# Patient Record
Sex: Male | Born: 1970 | Race: White | Hispanic: No | Marital: Married | Smoking: Former smoker
Health system: Southern US, Community
[De-identification: ages and names within clinical notes are randomized; demographics above are authoritative.]

## PROBLEM LIST (undated history)

## (undated) DIAGNOSIS — F411 Generalized anxiety disorder: Secondary | ICD-10-CM

## (undated) DIAGNOSIS — E782 Mixed hyperlipidemia: Secondary | ICD-10-CM

## (undated) DIAGNOSIS — R7302 Impaired glucose tolerance (oral): Secondary | ICD-10-CM

## (undated) DIAGNOSIS — E291 Testicular hypofunction: Secondary | ICD-10-CM

## (undated) DIAGNOSIS — R030 Elevated blood-pressure reading, without diagnosis of hypertension: Secondary | ICD-10-CM

## (undated) DIAGNOSIS — N200 Calculus of kidney: Secondary | ICD-10-CM

## (undated) HISTORY — DX: Mixed hyperlipidemia: E78.2

## (undated) HISTORY — DX: Calculus of kidney: N20.0

## (undated) HISTORY — DX: Testicular hypofunction: E29.1

## (undated) HISTORY — DX: Elevated blood-pressure reading, without diagnosis of hypertension: R03.0

## (undated) HISTORY — DX: Generalized anxiety disorder: F41.1

## (undated) HISTORY — DX: Impaired glucose tolerance (oral): R73.02

---

## 2003-11-19 HISTORY — PX: COLONOSCOPY: SHX174

## 2004-10-07 ENCOUNTER — Emergency Department: Payer: Self-pay | Admitting: Emergency Medicine

## 2014-01-20 ENCOUNTER — Ambulatory Visit: Payer: Self-pay | Admitting: Internal Medicine

## 2014-02-16 HISTORY — PX: LITHOTRIPSY: SUR834

## 2014-02-27 ENCOUNTER — Emergency Department: Payer: Self-pay | Admitting: Emergency Medicine

## 2014-02-27 LAB — COMPREHENSIVE METABOLIC PANEL
ALK PHOS: 109 U/L
ALT: 34 U/L (ref 12–78)
Albumin: 3.9 g/dL (ref 3.4–5.0)
Anion Gap: 6 — ABNORMAL LOW (ref 7–16)
BUN: 20 mg/dL — ABNORMAL HIGH (ref 7–18)
Bilirubin,Total: 0.4 mg/dL (ref 0.2–1.0)
CALCIUM: 9.1 mg/dL (ref 8.5–10.1)
CREATININE: 1 mg/dL (ref 0.60–1.30)
Chloride: 104 mmol/L (ref 98–107)
Co2: 29 mmol/L (ref 21–32)
EGFR (African American): >60
Glucose: 111 mg/dL — ABNORMAL HIGH (ref 65–99)
Osmolality: 281 (ref 275–301)
POTASSIUM: 3.9 mmol/L (ref 3.5–5.1)
SGOT(AST): 20 U/L (ref 15–37)
SODIUM: 139 mmol/L (ref 136–145)
TOTAL PROTEIN: 7.5 g/dL (ref 6.4–8.2)

## 2014-02-27 LAB — URINALYSIS, COMPLETE
BILIRUBIN, UR: NEGATIVE
Bacteria: NONE SEEN
Glucose,UR: NEGATIVE mg/dL (ref 0–75)
Ketone: NEGATIVE
Leukocyte Esterase: NEGATIVE
NITRITE: NEGATIVE
Ph: 8 (ref 4.5–8.0)
Protein: NEGATIVE
RBC,UR: 164 /HPF (ref 0–5)
SPECIFIC GRAVITY: 1.012 (ref 1.003–1.030)
WBC UR: 5 /HPF (ref 0–5)

## 2014-02-27 LAB — CBC WITH DIFFERENTIAL/PLATELET
Basophil #: 0.1 10*3/uL (ref 0.0–0.1)
Basophil %: 1.1 %
EOS PCT: 0.6 %
Eosinophil #: 0.1 10*3/uL (ref 0.0–0.7)
HCT: 40.7 % (ref 40.0–52.0)
HGB: 13.5 g/dL (ref 13.0–18.0)
LYMPHS PCT: 18.4 %
Lymphocyte #: 2 10*3/uL (ref 1.0–3.6)
MCH: 27.7 pg (ref 26.0–34.0)
MCHC: 33.2 g/dL (ref 32.0–36.0)
MCV: 83 fL (ref 80–100)
MONO ABS: 0.8 x10 3/mm (ref 0.2–1.0)
Monocyte %: 7.3 %
NEUTROS ABS: 7.9 10*3/uL — AB (ref 1.4–6.5)
NEUTROS PCT: 72.6 %
Platelet: 171 10*3/uL (ref 150–440)
RBC: 4.88 10*6/uL (ref 4.40–5.90)
RDW: 14.4 % (ref 11.5–14.5)
WBC: 10.8 10*3/uL — AB (ref 3.8–10.6)

## 2014-02-27 LAB — LIPASE, BLOOD: Lipase: 115 U/L (ref 73–393)

## 2014-02-27 LAB — TROPONIN I: Troponin-I: <0.02 ng/mL

## 2014-03-17 ENCOUNTER — Ambulatory Visit: Payer: Self-pay | Admitting: Urology

## 2014-04-04 ENCOUNTER — Ambulatory Visit: Payer: Self-pay | Admitting: Urology

## 2014-12-03 IMAGING — CR DG ABDOMEN 1V
1 series · 2 of 2 positions shown · non-contrast
Comparison: CT STONE STUDY dated 02/27/2014

CLINICAL DATA: Pre lithotripsy for right renal calculi

EXAM:
ABDOMEN - 1 VIEW

[Series 1: t abdomen supine · 0.14mm/px · 2 of 2 slices shown]
[im 1/2]
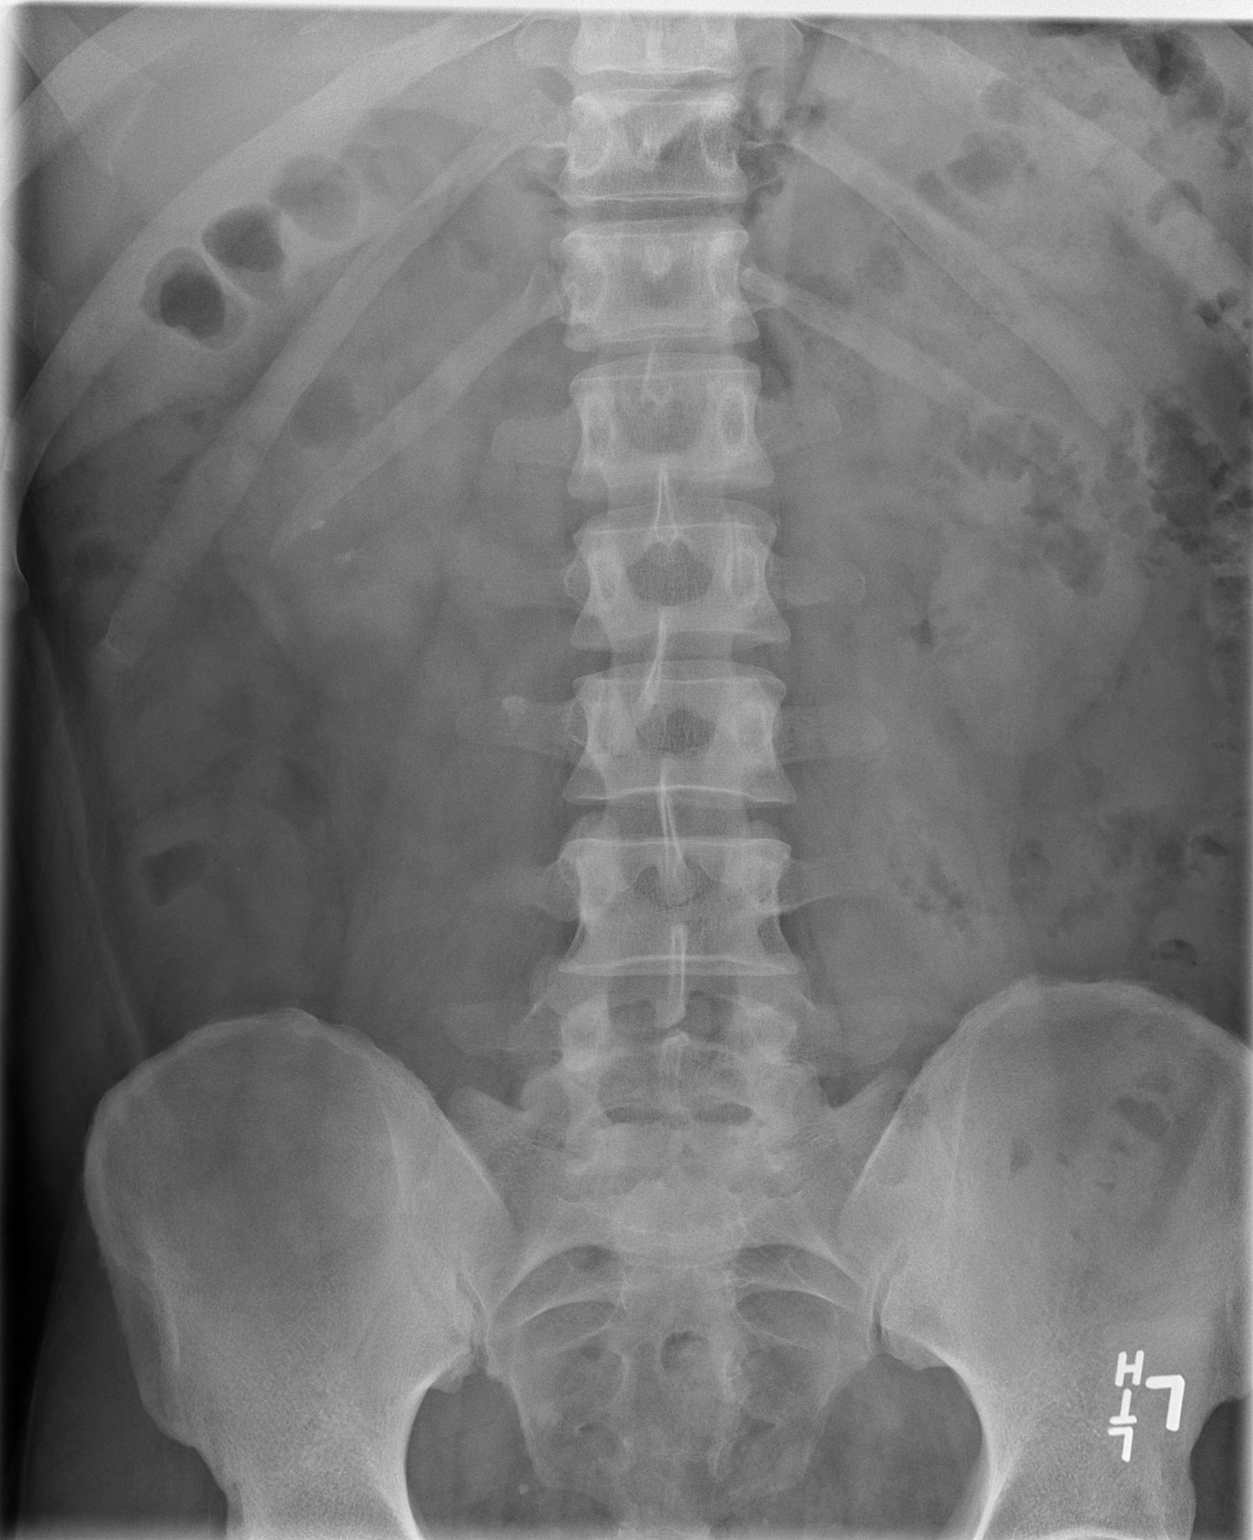
[im 2/2]
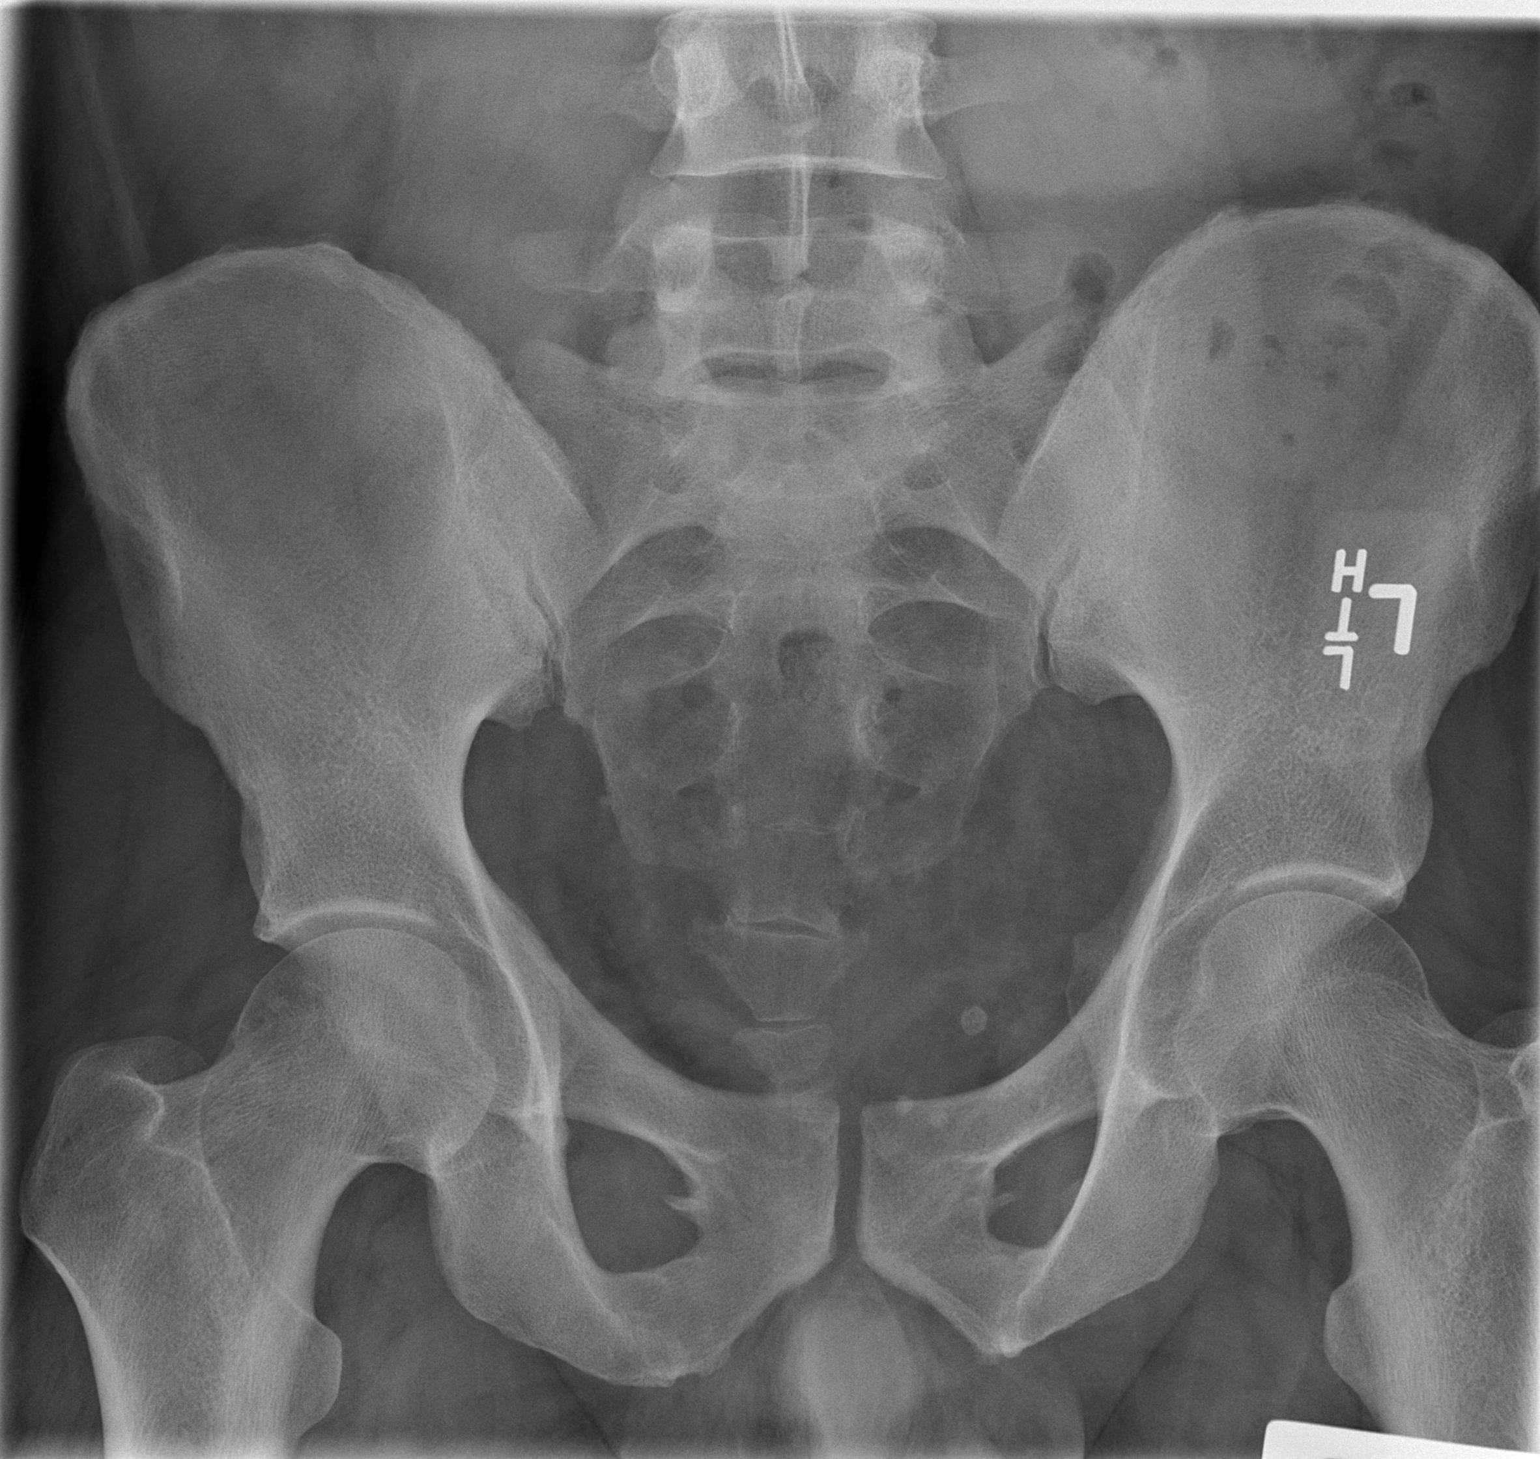

[2 of 2 positions shown; findings below may reference images not displayed]

FINDINGS: There is no bowel dilatation to suggest obstruction. There is no
evidence of pneumoperitoneum, portal venous gas or pneumatosis.
There are 2 right renal calculi. There is a 7 mm calcification
projecting over the right L3 transverse process likely representing
a ureteral calculus. The osseous structures are unremarkable.
IMPRESSION: There are 2 right renal calculi. There is a 7 mm calcification
projecting over the right L3 transverse process likely representing
a ureteral calculus.

## 2014-12-21 IMAGING — CR DG ABDOMEN 1V
1 series · 2 of 2 positions shown · non-contrast
Comparison: 03/17/2014

CLINICAL DATA: History of nephrolithiasis. Followup. No current
complaints.

EXAM:
ABDOMEN - 1 VIEW

[Series 1: t abdomen supine · 0.14mm/px · 2 of 2 slices shown]
[im 1/2]
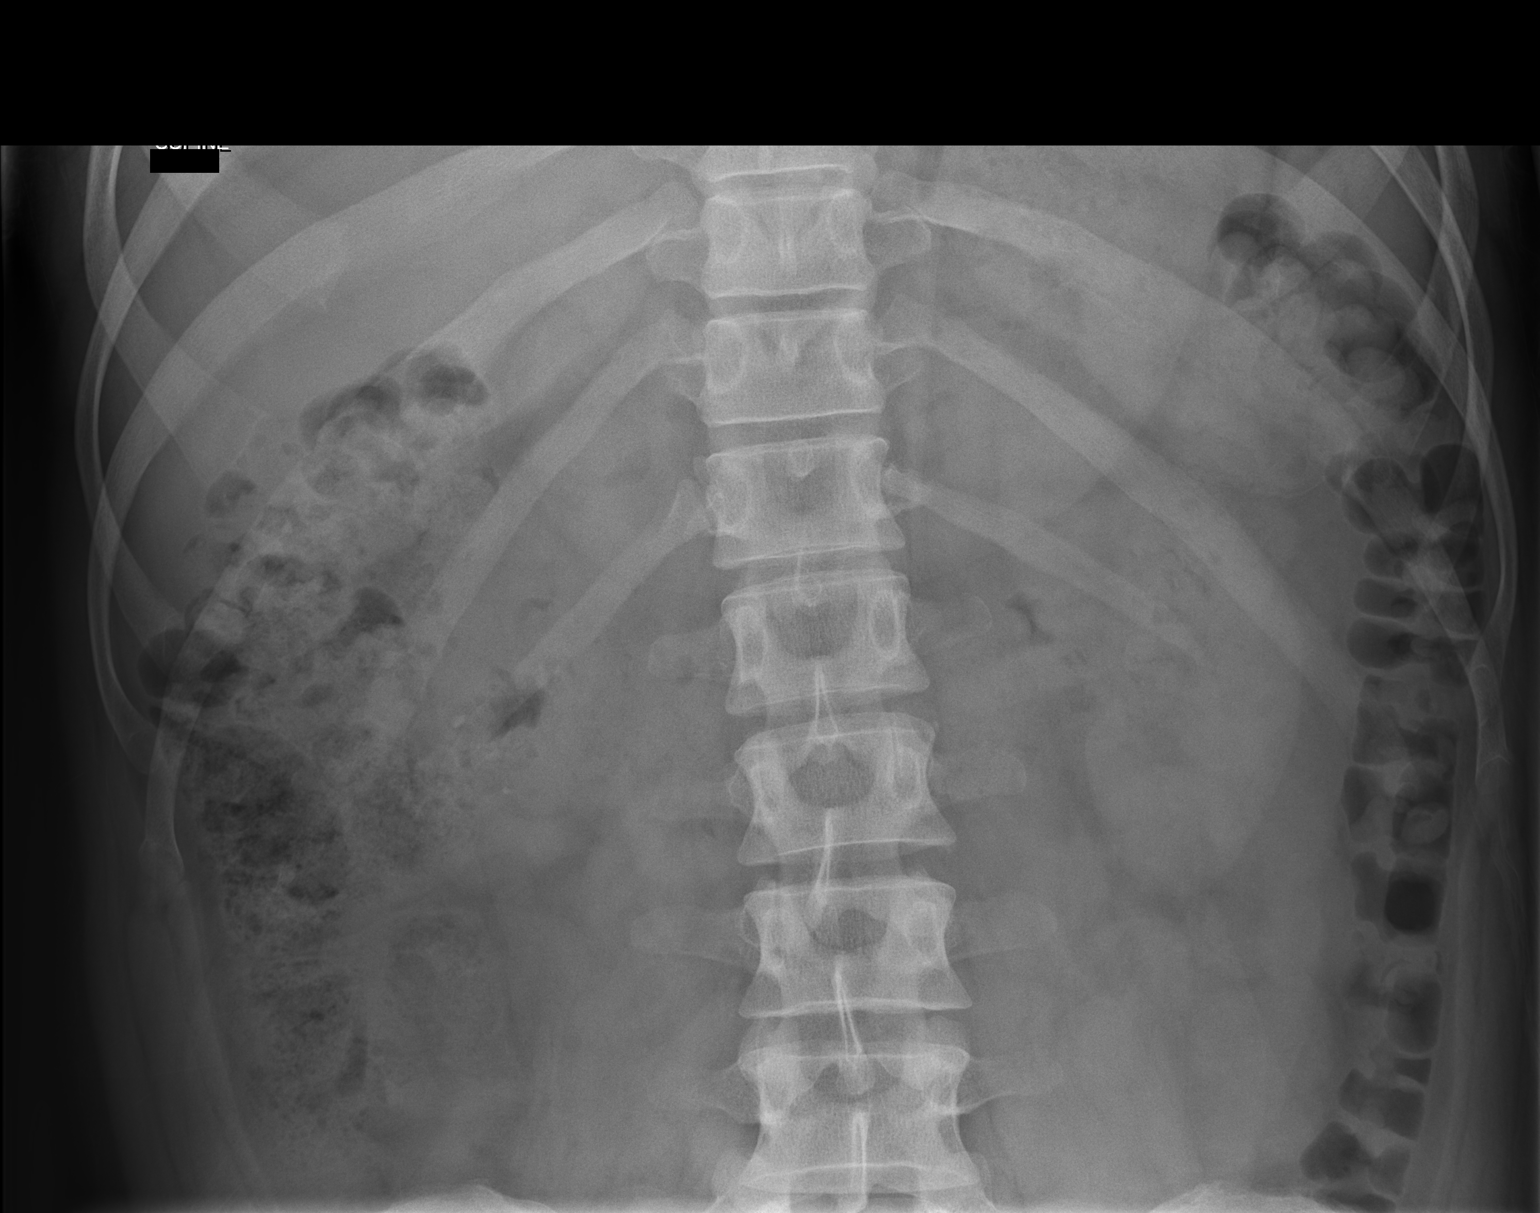
[im 2/2]
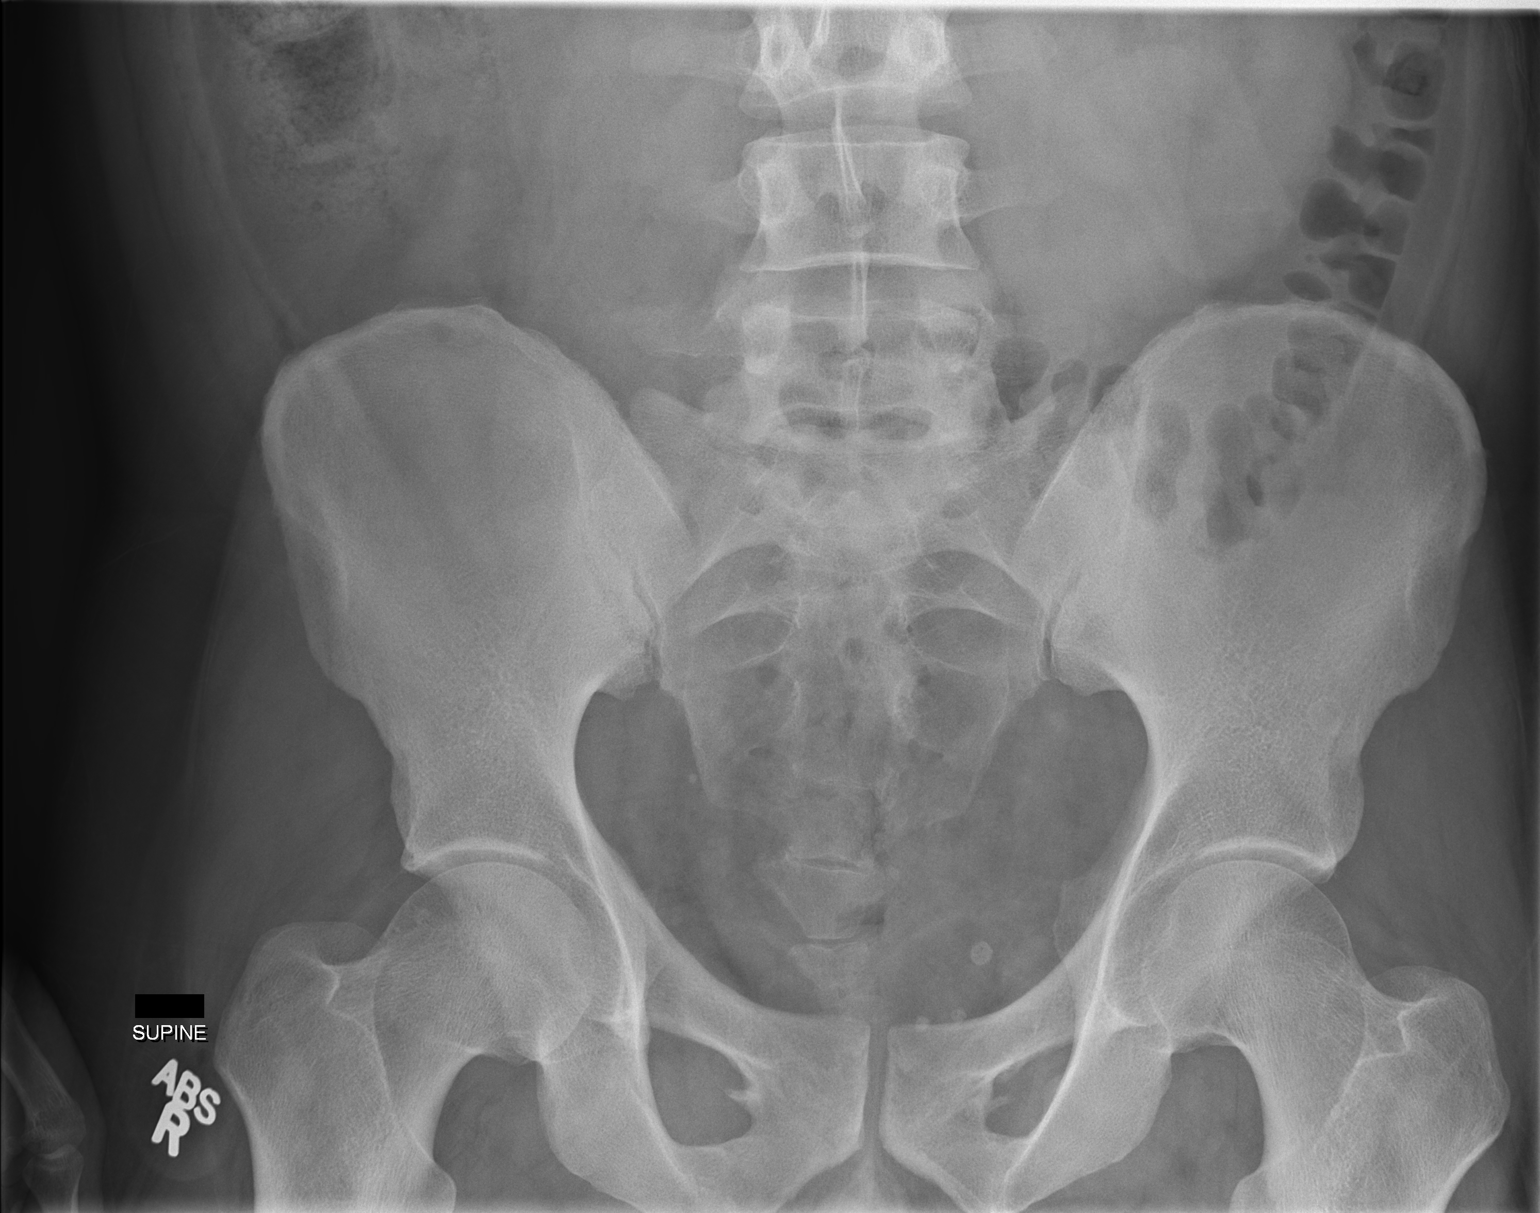

[2 of 2 positions shown; findings below may reference images not displayed]

FINDINGS: Right proximal ureteral stone previously noted is no longer present.
There are several small stones that project in the lower pole of the
right kidney. No stones are noted on the left.

Soft tissues are otherwise of possible. Normal bowel gas pattern.
Bony structures are unremarkable.
IMPRESSION: 1. No acute findings. No evidence of a ureteral stone. Previously
seen ureteral stone is no longer evident.
2. Small stable intrarenal stones on the right.

## 2014-12-29 ENCOUNTER — Ambulatory Visit: Payer: Self-pay | Admitting: Internal Medicine

## 2015-02-07 DIAGNOSIS — N200 Calculus of kidney: Secondary | ICD-10-CM | POA: Insufficient documentation

## 2015-08-16 ENCOUNTER — Encounter: Payer: Self-pay | Admitting: Internal Medicine

## 2015-08-16 ENCOUNTER — Other Ambulatory Visit: Payer: Self-pay | Admitting: Internal Medicine

## 2015-08-16 DIAGNOSIS — K219 Gastro-esophageal reflux disease without esophagitis: Secondary | ICD-10-CM | POA: Insufficient documentation

## 2015-08-16 DIAGNOSIS — M722 Plantar fascial fibromatosis: Secondary | ICD-10-CM | POA: Insufficient documentation

## 2015-08-16 DIAGNOSIS — L409 Psoriasis, unspecified: Secondary | ICD-10-CM | POA: Insufficient documentation

## 2015-08-17 ENCOUNTER — Ambulatory Visit (INDEPENDENT_AMBULATORY_CARE_PROVIDER_SITE_OTHER): Admitting: Internal Medicine

## 2015-08-17 ENCOUNTER — Encounter: Payer: Self-pay | Admitting: Internal Medicine

## 2015-08-17 VITALS — BP 128/82 | HR 80 | Temp 98.7°F | Ht 73.0 in | Wt 230.2 lb

## 2015-08-17 DIAGNOSIS — H109 Unspecified conjunctivitis: Secondary | ICD-10-CM | POA: Diagnosis not present

## 2015-08-17 DIAGNOSIS — J0110 Acute frontal sinusitis, unspecified: Secondary | ICD-10-CM

## 2015-08-17 MED ORDER — FLUTICASONE PROPIONATE 50 MCG/ACT NA SUSP: 2.0000 | Freq: Every day | NASAL | Status: DC

## 2015-08-17 MED ORDER — TRIAMCINOLONE ACETONIDE 0.025 % EX CREA: 1.0000 "application " | TOPICAL_CREAM | Freq: Two times a day (BID) | CUTANEOUS | Status: DC

## 2015-08-17 MED ORDER — NEOMYCIN-POLYMYXIN-DEXAMETH 3.5-10000-0.1 OP SUSP: 2.0000 [drp] | Freq: Four times a day (QID) | OPHTHALMIC | Status: DC

## 2015-08-17 MED ORDER — AMOXICILLIN-POT CLAVULANATE 875-125 MG PO TABS: 1.0000 | ORAL_TABLET | Freq: Two times a day (BID) | ORAL | Status: DC

## 2015-08-17 NOTE — Progress Notes (Signed)
Date:  08/17/2015   Name:  Jeffery Collins   DOB:  21-Apr-1971   MRN:  [id number]   Chief Complaint: Sinusitis Sinusitis This is a new problem. The current episode started in the past 7 days. The problem has been gradually worsening since onset. There has been no fever. He is experiencing no pain. Associated symptoms include congestion, coughing and sinus pressure. Pertinent negatives include no chills, ear pain, hoarse voice or shortness of breath. Past treatments include acetaminophen.   eye irritation - patient also notes some irritation of his left eye. Feels a little scratchy but it has not been painful. He may have had a little bit of crusting that he noticed in the morning.   Review of Systems:  Review of Systems  Constitutional: Positive for fatigue. Negative for fever and chills.  HENT: Positive for congestion and sinus pressure. Negative for ear pain and hoarse voice.   Respiratory: Positive for cough. Negative for shortness of breath and wheezing.   Cardiovascular: Negative for chest pain and palpitations.  Gastrointestinal: Negative for nausea and diarrhea.    Patient Active Problem List   Diagnosis Date Noted  . Gastro-esophageal reflux disease without esophagitis 08/16/2015  . Plantar fasciitis 08/16/2015  . Psoriasis of scalp 08/16/2015  . Calculus of kidney 02/07/2015    Prior to Admission medications   Medication Sig Start Date End Date Taking? Authorizing Provider  omeprazole (PRILOSEC OTC) 20 MG tablet Take 1 tablet by mouth daily. 12/01/14  Yes Historical Provider, MD  triamcinolone (KENALOG) 0.025 % cream Apply 1 application topically 2 (two) times daily. 05/13/14  Yes Historical Provider, MD    No Known Allergies  Past Surgical History  Procedure Laterality Date  . Lithotripsy  02/2014  . Colonoscopy  2005    Social History  Substance Use Topics  . Smoking status: Never Smoker   . Smokeless tobacco: None  . Alcohol Use: 1.2 oz/week    2 Standard  drinks or equivalent per week     Medication list has been reviewed and updated.  Physical Examination:  Physical Exam  Constitutional: He is oriented to person, place, and time. He appears well-developed and well-nourished.  HENT:  Right Ear: Tympanic membrane, external ear and ear canal normal. Tympanic membrane is not erythematous and not retracted.  Left Ear: Tympanic membrane, external ear and ear canal normal. Tympanic membrane is not erythematous and not retracted.  Nose: Right sinus exhibits no maxillary sinus tenderness and no frontal sinus tenderness. Left sinus exhibits maxillary sinus tenderness and frontal sinus tenderness.  Mouth/Throat: Uvula is midline and mucous membranes are normal. No oral lesions. Posterior oropharyngeal erythema present. No oropharyngeal exudate or posterior oropharyngeal edema.  Eyes: Conjunctivae are normal. Right eye exhibits no chemosis, no discharge and no exudate. Left eye exhibits no chemosis, no discharge and no exudate. Right conjunctiva is not injected. Left conjunctiva is not injected.  Neck: Normal range of motion. Neck supple.  Cardiovascular: Normal rate, regular rhythm and normal heart sounds.   Pulmonary/Chest: Effort normal and breath sounds normal. He has no wheezes. He has no rales.  Lymphadenopathy:    He has no cervical adenopathy.  Neurological: He is alert and oriented to person, place, and time.    BP 128/82 mmHg  Pulse 80  Temp(Src) 98.7 F (37.1 C)  Ht  (1.854 m)  Wt 230 lb 3.2 oz (104.418 kg)  BMI 30.38 kg/m2  SpO2 97%  Assessment and Plan: 1. Acute frontal sinusitis, recurrence not specified  Increase fluid intake Willette Flonase nasal spray - amoxicillin-clavulanate (AUGMENTIN) 875-125 MG tablet; Take 1 tablet by mouth 2 (two) times daily.  Dispense: 20 tablet; Refill: 0 - fluticasone (FLONASE) 50 MCG/ACT nasal spray; Place 2 sprays into both nostrils daily.  Dispense: 16 g; Refill: 6  2. Conjunctivitis of  left eye Early symptoms; will treat with Maxitrol - neomycin-polymyxin b-dexamethasone (MAXITROL) 3.5-10000-0.1 SUSP; Place 2 drops into both eyes every 6 (six) hours.  Dispense: 5 mL; Refill: 0   Bari Edward, MD [facility]  08/17/2015

## 2015-09-08 ENCOUNTER — Ambulatory Visit (INDEPENDENT_AMBULATORY_CARE_PROVIDER_SITE_OTHER): Payer: Self-pay | Admitting: Internal Medicine

## 2015-09-08 ENCOUNTER — Encounter: Payer: Self-pay | Admitting: Internal Medicine

## 2015-09-08 VITALS — BP 120/82 | HR 72 | Ht 73.0 in | Wt 231.6 lb

## 2015-09-08 DIAGNOSIS — M62830 Muscle spasm of back: Secondary | ICD-10-CM

## 2015-09-08 DIAGNOSIS — M25511 Pain in right shoulder: Secondary | ICD-10-CM

## 2015-09-08 MED ORDER — TRAMADOL HCL 50 MG PO TABS: 50.0000 mg | ORAL_TABLET | Freq: Three times a day (TID) | ORAL | Status: DC | PRN
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
## 2015-10-18 ENCOUNTER — Ambulatory Visit: Admitting: Internal Medicine

## 2015-10-18 ENCOUNTER — Encounter: Payer: Self-pay | Admitting: Internal Medicine

## 2015-10-18 DIAGNOSIS — M7541 Impingement syndrome of right shoulder: Secondary | ICD-10-CM | POA: Insufficient documentation

## 2015-10-24 ENCOUNTER — Ambulatory Visit: Admitting: Internal Medicine

## 2016-05-07 ENCOUNTER — Ambulatory Visit (INDEPENDENT_AMBULATORY_CARE_PROVIDER_SITE_OTHER): Admitting: Internal Medicine

## 2016-05-07 ENCOUNTER — Encounter: Payer: Self-pay | Admitting: Internal Medicine

## 2016-05-07 VITALS — BP 138/84 | HR 74 | Resp 16 | Ht 73.0 in | Wt 233.0 lb

## 2016-05-07 DIAGNOSIS — M7121 Synovial cyst of popliteal space [Baker], right knee: Secondary | ICD-10-CM | POA: Diagnosis not present

## 2016-05-07 NOTE — Progress Notes (Signed)
    Date:  05/07/2016   Name:  Jeffery Collins   DOB:  11/01/1971   MRN:  [id number]   Chief Complaint: Ankle Pain Knee Pain  The incident occurred 12 to 24 hours ago. There was no injury mechanism. The pain is present in the right knee. The pain is moderate. The pain has been fluctuating since onset. Associated symptoms include a loss of motion. Pertinent negatives include no muscle weakness, numbness or tingling. The symptoms are aggravated by movement and weight bearing. He has tried NSAIDs and heat for the symptoms. The treatment provided mild relief.      Review of Systems  Constitutional: Negative for fever and chills.  Respiratory: Negative for cough, chest tightness and shortness of breath.   Cardiovascular: Negative for chest pain, palpitations and leg swelling.  Musculoskeletal: Positive for myalgias, arthralgias and gait problem.  Neurological: Negative for tingling and numbness.  Hematological: Negative for adenopathy.    Patient Active Problem List   Diagnosis Date Noted  . Impingement syndrome of right shoulder 10/18/2015  . Gastro-esophageal reflux disease without esophagitis 08/16/2015  . Plantar fasciitis 08/16/2015  . Psoriasis of scalp 08/16/2015  . Calculus of kidney 02/07/2015    Prior to Admission medications   Medication Sig Start Date End Date Taking? Authorizing Provider  omeprazole (PRILOSEC OTC) 20 MG tablet Take 1 tablet by mouth daily. 12/01/14  Yes Historical Provider, MD    No Known Allergies  Past Surgical History  Procedure Laterality Date  . Lithotripsy  02/2014  . Colonoscopy  2005    Social History  Substance Use Topics  . Smoking status: Never Smoker   . Smokeless tobacco: None  . Alcohol Use: 1.2 oz/week    2 Standard drinks or equivalent per week     Medication list has been reviewed and updated.   Physical Exam  Constitutional: He is oriented to person, place, and time. He appears well-developed. No distress.  HENT:    Head: Normocephalic and atraumatic.  Cardiovascular: Normal rate, regular rhythm and normal heart sounds.   Pulmonary/Chest: Effort normal and breath sounds normal. No respiratory distress.  Musculoskeletal: Normal range of motion.       Right knee: He exhibits normal range of motion, no swelling and no effusion.       Left knee: He exhibits normal range of motion, no swelling and no effusion. Tenderness (posterior knee and mid calf) found.  Neurological: He is alert and oriented to person, place, and time. He has normal strength and normal reflexes. No sensory deficit. Gait normal.  Skin: Skin is warm and dry. No rash noted.  Psychiatric: He has a normal mood and affect. His behavior is normal. Thought content normal.  Nursing note and vitals reviewed.   BP 138/84 mmHg  Pulse 74  Resp 16  Ht 6\' 1"  (1.854 m)  Wt 233 lb (105.688 kg)  BMI 30.75 kg/m2  SpO2 98%  Assessment and Plan: 1. Baker's cyst of knee, right Suspected Continue Advil 800 mg tid, heat, elevation Refer to Ortho if persistent - VAS US LOWER EXTREMITY VENOUS (DVT); Future   Bari EdwardLaura Julee Stoll, MD [facility]  05/07/2016

## 2016-05-07 NOTE — Patient Instructions (Signed)
Baker Cyst °A Baker cyst is a sac-like structure that forms in the back of the knee. It is filled with the same fluid that is located in your knee. This fluid lubricates the bones and cartilage of the knee and allows them to move over each other more easily. °CAUSES  °When the knee becomes injured or inflamed, increased fluid forms in the knee. When this happens, the joint lining is pushed out behind the knee and forms the Baker cyst. This cyst may also be caused by inflammation from arthritic conditions and infections. °SIGNS AND SYMPTOMS  °A Baker cyst usually has no symptoms. When the cyst is substantially enlarged: °· You may feel pressure behind the knee, stiffness in the knee, or a mass in the area behind the knee. °· You may develop pain, redness, and swelling in the calf.  This can suggest a blood clot and requires evaluation by your health care provider. °DIAGNOSIS  °A Baker cyst is most often found during an ultrasound exam. This exam may have been performed for other reasons, and the cyst was found incidentally. Sometimes an MRI is used. This picks up other problems within a joint that an ultrasound exam may not. If the Baker cyst developed immediately after an injury, X-ray exams may be used to diagnose the cyst. °TREATMENT  °The treatment depends on the cause of the cyst. Anti-inflammatory medicines and rest often will be prescribed. If the cyst is caused by a bacterial infection, antibiotic medicines may be prescribed.  °HOME CARE INSTRUCTIONS  °· If the cyst was caused by an injury, for the first 24 hours, keep the injured leg elevated on 2 pillows while lying down. °· For the first 24 hours while you are awake, apply ice to the injured area: °¨ Put ice in a plastic bag. °¨ Place a towel between your skin and the bag. °¨ Leave the ice on for 20 minutes, 2-3 times a day. °· Only take over-the-counter or prescription medicines for pain, discomfort, or fever as directed by your health care  provider. °· Only take antibiotic medicine as directed. Make sure to finish it even if you start to feel better. °MAKE SURE YOU:  °· Understand these instructions. °· Will watch your condition. °· Will get help right away if you are not doing well or get worse. °  °This information is not intended to replace advice given to you by your health care provider. Make sure you discuss any questions you have with your health care provider. °  °Document Released: 11/04/2005 Document Revised: 08/25/2013 Document Reviewed: 06/16/2013 °Elsevier Interactive Patient Education ©2016 [employer] Inc[employer] ° °

## 2016-09-24 ENCOUNTER — Ambulatory Visit (INDEPENDENT_AMBULATORY_CARE_PROVIDER_SITE_OTHER)

## 2016-09-24 DIAGNOSIS — Z23 Encounter for immunization: Secondary | ICD-10-CM | POA: Diagnosis not present

## 2016-09-25 ENCOUNTER — Ambulatory Visit

## 2016-11-19 ENCOUNTER — Other Ambulatory Visit: Payer: Self-pay | Admitting: *Deleted

## 2016-11-19 ENCOUNTER — Telehealth: Payer: Self-pay | Admitting: Internal Medicine

## 2016-11-19 NOTE — Telephone Encounter (Signed)
Pt called need refill for psorises

## 2016-11-20 NOTE — Telephone Encounter (Signed)
Called pt back Dr. Judithann GravesBerglund have not treat him for this problem. Pt stated would like to make an appointment.Marland Kitchen..Marland Kitchen

## 2016-11-20 NOTE — Telephone Encounter (Signed)
I have never treated him for psoriasis and do not have any medications for this listed in his chart.

## 2016-11-26 ENCOUNTER — Ambulatory Visit (INDEPENDENT_AMBULATORY_CARE_PROVIDER_SITE_OTHER): Admitting: Internal Medicine

## 2016-11-26 ENCOUNTER — Encounter: Payer: Self-pay | Admitting: Internal Medicine

## 2016-11-26 VITALS — BP 136/90 | HR 72 | Temp 97.8°F | Ht 73.0 in | Wt 232.0 lb

## 2016-11-26 DIAGNOSIS — R030 Elevated blood-pressure reading, without diagnosis of hypertension: Secondary | ICD-10-CM | POA: Insufficient documentation

## 2016-11-26 DIAGNOSIS — L409 Psoriasis, unspecified: Secondary | ICD-10-CM

## 2016-11-26 MED ORDER — TRIAMCINOLONE ACETONIDE 0.025 % EX OINT: 1.0000 "application " | TOPICAL_OINTMENT | Freq: Two times a day (BID) | CUTANEOUS | 0 refills | Status: DC

## 2016-11-26 NOTE — Patient Instructions (Addendum)
Goal blood pressure 130/80 or less. Check BP several times a week for several weeks and if persistently elevated, return for visit and bring your cuff  DASH Eating Plan DASH stands for "Dietary Approaches to Stop Hypertension." The DASH eating plan is a healthy eating plan that has been shown to reduce high blood pressure (hypertension). Additional health benefits may include reducing the risk of type 2 diabetes mellitus, heart disease, and stroke. The DASH eating plan may also help with weight loss. What do I need to know about the DASH eating plan? For the DASH eating plan, you will follow these general guidelines:  Choose foods with less than 150 milligrams of sodium per serving (as listed on the food label).  Use salt-free seasonings or herbs instead of table salt or sea salt.  Check with your health care provider or pharmacist before using salt substitutes.  Eat lower-sodium products. These are often labeled as "low-sodium" or "no salt added."  Eat fresh foods. Avoid eating a lot of canned foods.  Eat more vegetables, fruits, and low-fat dairy products.  Choose whole grains. Look for the word "whole" as the first word in the ingredient list.  Choose fish and skinless chicken or [place]key more often than red meat. Limit fish, poultry, and meat to 6 oz (170 g) each day.  Limit sweets, desserts, sugars, and sugary drinks.  Choose heart-healthy fats.  Eat more home-cooked food and less restaurant, buffet, and fast food.  Limit fried foods.  Do not fry foods. Cook foods using methods such as baking, boiling, grilling, and broiling instead.  When eating at a restaurant, ask that your food be prepared with less salt, or no salt if possible. What foods can I eat? Seek help from a dietitian for individual calorie needs. Grains  Whole grain or whole wheat bread. Brown rice. Whole grain or whole wheat pasta. Quinoa, bulgur, and whole grain cereals. Low-sodium cereals. Corn or whole wheat  flour tortillas. Whole grain cornbread. Whole grain crackers. Low-sodium crackers. Vegetables  Fresh or frozen vegetables (raw, steamed, roasted, or grilled). Low-sodium or reduced-sodium tomato and vegetable juices. Low-sodium or reduced-sodium tomato sauce and paste. Low-sodium or reduced-sodium canned vegetables. Fruits  All fresh, canned (in natural juice), or frozen fruits. Meat and Other Protein Products  Ground beef (85% or leaner), grass-fed beef, or beef trimmed of fat. Skinless chicken or [place]key. Ground chicken or [place]key. Pork trimmed of fat. All fish and seafood. Eggs. Dried beans, peas, or lentils. Unsalted nuts and seeds. Unsalted canned beans. Dairy  Low-fat dairy products, such as skim or 1% milk, 2% or reduced-fat cheeses, low-fat ricotta or cottage cheese, or plain low-fat yogurt. Low-sodium or reduced-sodium cheeses. Fats and Oils  Tub margarines without trans fats. Light or reduced-fat mayonnaise and salad dressings (reduced sodium). Avocado. Safflower, olive, or canola oils. Natural peanut or almond butter. Other  Unsalted popcorn and pretzels. The items listed above may not be a complete list of recommended foods or beverages. Contact your dietitian for more options.  What foods are not recommended? Grains  White bread. White pasta. White rice. Refined cornbread. Bagels and croissants. Crackers that contain trans fat. Vegetables  Creamed or fried vegetables. Vegetables in a cheese sauce. Regular canned vegetables. Regular canned tomato sauce and paste. Regular tomato and vegetable juices. Fruits  Canned fruit in light or heavy syrup. Fruit juice. Meat and Other Protein Products  Fatty cuts of meat. Ribs, chicken wings, bacon, sausage, bologna, salami, chitterlings, fatback, hot dogs, bratwurst, and packaged  luncheon meats. Salted nuts and seeds. Canned beans with salt. Dairy  Whole or 2% milk, cream, half-and-half, and cream cheese. Whole-fat or sweetened yogurt. Full-fat  cheeses or blue cheese. Nondairy creamers and whipped toppings. Processed cheese, cheese spreads, or cheese curds. Condiments  Onion and garlic salt, seasoned salt, table salt, and sea salt. Canned and packaged gravies. Worcestershire sauce. Tartar sauce. Barbecue sauce. Teriyaki sauce. Soy sauce, including reduced sodium. Steak sauce. Fish sauce. Oyster sauce. Cocktail sauce. Horseradish. Ketchup and mustard. Meat flavorings and tenderizers. Bouillon cubes. Hot sauce. Tabasco sauce. Marinades. Taco seasonings. Relishes. Fats and Oils  Butter, stick margarine, lard, shortening, ghee, and bacon fat. Coconut, palm kernel, or palm oils. Regular salad dressings. Other  Pickles and olives. Salted popcorn and pretzels. The items listed above may not be a complete list of foods and beverages to avoid. Contact your dietitian for more information.  Where can I find more information? National Heart, Lung, and Blood Institute: travelstabloid.com This information is not intended to replace advice given to you by your health care provider. Make sure you discuss any questions you have with your health care provider. Document Released: 10/24/2011 Document Revised: 04/11/2016 Document Reviewed: 09/08/2013 Elsevier Interactive Patient Education  2017 [employer].

## 2016-11-26 NOTE — Progress Notes (Signed)
    Date:  11/26/2016   Name:  Jeffery Collins   DOB:  01/20/1971   MRN:  [id number]   Chief Complaint: Psoriasis Psoriasis  This is a recurrent problem. The current episode started more than 1 year ago. The problem has been gradually worsening since onset. The affected locations include the scalp and face.  He was given TAC years ago in the [employer] [facility] but had not needed it until recently.  The cold weather he is blaming for the flare.   Review of Systems  Eyes: Negative for visual disturbance.  Respiratory: Negative for shortness of breath.   Cardiovascular: Positive for chest pain (he notices that his BP is slightly elevated on the last few visits). Negative for palpitations.  Skin: Positive for rash.    Patient Active Problem List   Diagnosis Date Noted  . Elevated blood pressure reading 11/26/2016  . Impingement syndrome of right shoulder 10/18/2015  . Gastro-esophageal reflux disease without esophagitis 08/16/2015  . Plantar fasciitis 08/16/2015  . Psoriasis of scalp 08/16/2015  . Calculus of kidney 02/07/2015    Prior to Admission medications   Medication Sig Start Date End Date Taking? Authorizing Provider  omeprazole (PRILOSEC OTC) 20 MG tablet Take 1 tablet by mouth daily. 12/01/14  Yes Historical Provider, MD    No Known Allergies  Past Surgical History:  Procedure Laterality Date  . COLONOSCOPY  2005  . LITHOTRIPSY  02/2014    Social History  Substance Use Topics  . Smoking status: Never Smoker  . Smokeless tobacco: Not on file  . Alcohol use 1.2 oz/week    2 Standard drinks or equivalent per week     Medication list has been reviewed and updated.   Physical Exam  Constitutional: He is oriented to person, place, and time. He appears well-developed. No distress.  HENT:  Head: Normocephalic and atraumatic.  Cardiovascular: Normal rate, regular rhythm and normal heart sounds.   Pulmonary/Chest: Effort normal and breath sounds normal. No respiratory  distress.  Musculoskeletal: Normal range of motion.  Neurological: He is alert and oriented to person, place, and time.  Skin: Skin is warm and dry. No rash noted. There is erythema.  Scattered plaques over scalp - mild erythema and minimal scaling.  Evidence of excoriation on several.  Few lesions behind ears.  Slight redness across bridge of nose and forehead.  Psychiatric: He has a normal mood and affect. His behavior is normal. Thought content normal.  Nursing note and vitals reviewed.   BP 136/90   Pulse 72   Temp 97.8 F (36.6 C)   Ht 6\' 1"  (1.854 m)   Wt 232 lb (105.2 kg)   SpO2 98%   BMI 30.61 kg/m   Assessment and Plan: 1. Psoriasis of scalp Start with low dose on scalp and face May need stronger on scalp if no improvement - triamcinolone (KENALOG) 0.025 % ointment; Apply 1 application topically 2 (two) times daily.  Dispense: 30 g; Refill: 0  2. Elevated blood pressure reading DASH diet Monitor at home - return if consistently > 130/80   Bari EdwardLaura Takiera Mayo, MD [facility]  11/26/2016

## 2017-01-01 ENCOUNTER — Ambulatory Visit (INDEPENDENT_AMBULATORY_CARE_PROVIDER_SITE_OTHER): Admitting: Internal Medicine

## 2017-01-01 ENCOUNTER — Encounter: Payer: Self-pay | Admitting: Internal Medicine

## 2017-01-01 VITALS — BP 142/92 | HR 104 | Temp 99.1°F | Ht 73.0 in | Wt 234.0 lb

## 2017-01-01 DIAGNOSIS — J0100 Acute maxillary sinusitis, unspecified: Secondary | ICD-10-CM

## 2017-01-01 DIAGNOSIS — J111 Influenza due to unidentified influenza virus with other respiratory manifestations: Secondary | ICD-10-CM

## 2017-01-01 DIAGNOSIS — R69 Illness, unspecified: Secondary | ICD-10-CM | POA: Diagnosis not present

## 2017-01-01 MED ORDER — AZITHROMYCIN 250 MG PO TABS: ORAL_TABLET | ORAL | 0 refills | Status: DC

## 2017-01-01 MED ORDER — OSELTAMIVIR PHOSPHATE 75 MG PO CAPS
75.0000 mg | ORAL_CAPSULE | Freq: Two times a day (BID) | ORAL | 0 refills | Status: DC
Start: 2017-01-01 — End: 2017-02-20

## 2017-01-01 MED ORDER — GUAIFENESIN-CODEINE 100-10 MG/5ML PO SYRP
5.0000 mL | ORAL_SOLUTION | Freq: Three times a day (TID) | ORAL | 0 refills | Status: DC | PRN
Start: 2017-01-01 — End: 2017-02-20

## 2017-01-01 NOTE — Patient Instructions (Signed)
Take Advil 600 mg four times a day

## 2017-01-01 NOTE — Progress Notes (Signed)
Date:  01/01/2017   Name:  Jeffery Collins   DOB:  10/19/1971   MRN:  [id number]   Chief Complaint: Sinus Problem (Pt stated sinus problem, body ache, vomiting for 2 days) URI   This is a new problem. The current episode started yesterday. The problem has been gradually worsening. The maximum temperature recorded prior to his arrival was 100.4 - 100.9 F. The fever has been present for 1 to 2 days. Associated symptoms include congestion, coughing, headaches, nausea, sinus pain and vomiting. Pertinent negatives include no chest pain, diarrhea, plugged ear sensation, rash or wheezing. He has tried acetaminophen for the symptoms.  His wife and son both had confirmed influenza 2 weeks ago.   He is taking tylenol or advil for fever.    Review of Systems  Constitutional: Positive for chills, fatigue and fever.  HENT: Positive for congestion and sinus pain.   Respiratory: Positive for cough. Negative for chest tightness, shortness of breath and wheezing.   Cardiovascular: Negative for chest pain and palpitations.  Gastrointestinal: Positive for nausea and vomiting. Negative for diarrhea.  Musculoskeletal: Positive for myalgias.  Skin: Negative for rash.  Neurological: Positive for headaches. Negative for dizziness and numbness.    Patient Active Problem List   Diagnosis Date Noted  . Elevated blood pressure reading 11/26/2016  . Impingement syndrome of right shoulder 10/18/2015  . Gastro-esophageal reflux disease without esophagitis 08/16/2015  . Plantar fasciitis 08/16/2015  . Psoriasis of scalp 08/16/2015  . Calculus of kidney 02/07/2015    Prior to Admission medications   Medication Sig Start Date End Date Taking? Authorizing Provider  omeprazole (PRILOSEC OTC) 20 MG tablet Take 1 tablet by mouth daily. 12/01/14  Yes Historical Provider, MD  triamcinolone (KENALOG) 0.025 % ointment Apply 1 application topically 2 (two) times daily. 11/26/16  Yes Reubin MilanLaura H Traycen Goyer, MD    No Known  Allergies  Past Surgical History:  Procedure Laterality Date  . COLONOSCOPY  2005  . LITHOTRIPSY  02/2014    Social History  Substance Use Topics  . Smoking status: Never Smoker  . Smokeless tobacco: Never Used  . Alcohol use 1.2 oz/week    2 Standard drinks or equivalent per week     Medication list has been reviewed and updated.   Physical Exam  Constitutional: He has a sickly appearance.  HENT:  Right Ear: Tympanic membrane and ear canal normal.  Left Ear: Tympanic membrane and ear canal normal.  Nose: Right sinus exhibits maxillary sinus tenderness. Left sinus exhibits maxillary sinus tenderness.  Mouth/Throat: No posterior oropharyngeal erythema.  Cardiovascular: Regular rhythm and normal heart sounds.  Tachycardia present.   Pulmonary/Chest: He has no decreased breath sounds. He has no wheezes. He has no rhonchi.  Nursing note and vitals reviewed.   BP (!) 142/92   Pulse (!) 104   Temp 99.1 F (37.3 C)   Ht 6\' 1"  (1.854 m)   Wt 234 lb (106.1 kg)   SpO2 97%   BMI 30.87 kg/m   Assessment and Plan: 1. Acute non-recurrent maxillary sinusitis - azithromycin (ZITHROMAX Z-PAK) 250 MG tablet; UAD  Dispense: 6 each; Refill: 0  2. Influenza-like illness Continue Advil 600 mg four times per day Fluids, rest (note for light duty given) - oseltamivir (TAMIFLU) 75 MG capsule; Take 1 capsule (75 mg total) by mouth 2 (two) times daily.  Dispense: 10 capsule; Refill: 0 - guaiFENesin-codeine (ROBITUSSIN AC) 100-10 MG/5ML syrup; Take 5 mLs by mouth 3 (three) times daily  as needed for cough.  Dispense: 150 mL; Refill: 0   Bari Edward, MD [facility]  01/01/2017

## 2017-02-20 ENCOUNTER — Encounter: Payer: Self-pay | Admitting: Internal Medicine

## 2017-02-20 ENCOUNTER — Ambulatory Visit (INDEPENDENT_AMBULATORY_CARE_PROVIDER_SITE_OTHER): Admitting: Internal Medicine

## 2017-02-20 VITALS — BP 122/66 | HR 68 | Ht 73.0 in | Wt 238.0 lb

## 2017-02-20 DIAGNOSIS — B070 Plantar wart: Secondary | ICD-10-CM

## 2017-02-20 NOTE — Progress Notes (Signed)
    Date:  02/20/2017   Name:  Jeffery Collins   DOB:  [birth year]   MRN:  [id number]   Jeffery Collins has noticed a hard lesion on the ball of his left foot for a short while.  Over the past 2 weeks it is more painful to walk and run and now he has another lesion near the heel.     Review of Systems  Constitutional: Negative for chills and fatigue.  Respiratory: Negative for shortness of breath.   Cardiovascular: Negative for chest pain.  Skin:       Hard lesions on left foot  Neurological: Negative for tremors and weakness.    Patient Active Problem List   Diagnosis Date Noted  . Elevated blood pressure reading 11/26/2016  . Impingement syndrome of right shoulder 10/18/2015  . Gastro-esophageal reflux disease without esophagitis 08/16/2015  . Plantar fasciitis 08/16/2015  . Psoriasis of scalp 08/16/2015  . Calculus of kidney 02/07/2015    Prior to Admission medications   Medication Sig Start Date End Date Taking? Authorizing Provider  cyclobenzaprine (FLEXERIL) 5 MG tablet  01/10/17   Historical Provider, MD  etodolac (LODINE) 500 MG tablet Take 1 tablet by mouth 2 (two) times daily. 01/10/17   Historical Provider, MD  omeprazole (PRILOSEC OTC) 20 MG tablet Take 1 tablet by mouth daily. 12/01/14   Historical Provider, MD  triamcinolone (KENALOG) 0.025 % ointment Apply 1 application topically 2 (two) times daily. 11/26/16   Reubin Milan, MD    No Known Allergies  Past Surgical History:  Procedure Laterality Date  . COLONOSCOPY  2005  . LITHOTRIPSY  02/2014    Social History  Substance Use Topics  . Smoking status: Never Smoker  . Smokeless tobacco: Never Used  . Alcohol use 1.2 oz/week    2 Standard drinks or equivalent per week     Medication list has been reviewed and updated.   Physical Exam  Constitutional: He is oriented to person, place, and time. He appears well-developed. No distress.  HENT:  Head: Normocephalic and atraumatic.  Pulmonary/Chest:  Effort normal. No respiratory distress.  Musculoskeletal: Normal range of motion.       Feet:  Neurological: He is alert and oriented to person, place, and time.  Skin: Skin is warm and dry. No rash noted.  Psychiatric: He has a normal mood and affect. His behavior is normal. Thought content normal.  Nursing note and vitals reviewed.   BP 122/66 (BP Location: Right Arm, Patient Position: Sitting, Cuff Size: Large)   Pulse 68   Ht  (1.854 m)   Wt 238 lb (108 kg)   SpO2 97%   BMI 31.40 kg/m   Assessment and Plan: 1. Plantar wart of left foot Try compound W otc Will likely need more definitive tx with Dermatology - Ambulatory referral to Dermatology   No orders of the defined types were placed in this encounter.   Bari Edward, MD [facility]  02/20/2017

## 2017-02-24 ENCOUNTER — Ambulatory Visit: Payer: Self-pay | Admitting: Podiatry

## 2017-03-06 ENCOUNTER — Encounter: Payer: Self-pay | Admitting: Podiatry

## 2017-03-06 ENCOUNTER — Ambulatory Visit (INDEPENDENT_AMBULATORY_CARE_PROVIDER_SITE_OTHER): Admitting: Podiatry

## 2017-03-06 VITALS — BP 128/91 | HR 86

## 2017-03-06 DIAGNOSIS — B070 Plantar wart: Secondary | ICD-10-CM

## 2017-03-06 DIAGNOSIS — L989 Disorder of the skin and subcutaneous tissue, unspecified: Secondary | ICD-10-CM

## 2017-03-06 NOTE — Addendum Note (Signed)
Addended byMaury Dus, Jahnessa Vanduyn L on: 03/06/2017 10:47 AM   Modules accepted: Orders

## 2017-03-06 NOTE — Progress Notes (Signed)
   Subjective:    Patient ID: Jeffery Collins, male    DOB: 06-08-1971, 46 y.o.   MRN: [id number]  HPI    Review of Systems  All other systems reviewed and are negative.      Objective:   Physical Exam GENERAL APPEARANCE: Alert, conversant. Appropriately groomed. No acute distress.  VASCULAR: Pedal pulses are  palpable at  [facility] and PT bilateral.  Capillary refill time is immediate to all digits,  Normal temperature gradient.  Digital hair growth is present bilateral  NEUROLOGIC: sensation is normal to 5.07 monofilament at 5/5 sites bilateral.  Light touch is intact bilateral, Muscle strength normal.  MUSCULOSKELETAL: acceptable muscle strength, tone and stability bilateral.  Intrinsic muscluature intact bilateral.  Rectus appearance of foot and digits noted bilateral.   DERMATOLOGIC: skin color, texture, and turgor are within normal limits.  No preulcerative lesions or ulcers  are seen, no interdigital maceration noted.  No open lesions present.  Digital nails are asymptomatic. No drainage noted. Two well defined, well circumscribed lesions with cauliflower center.  Pain from lateral pressure.         Assessment & Plan:  Benign skin lesion  Plantar warts  IE  Excision of wart.  This patient was anesthetized with mixture of 2% lidocaine plain and 2 % lidocaine with epi. at the site of the skin lesion.  The surgical site was then washed with betadine and alcohol.  Using a punch the lesion was excised and passed off as specimen.  The surgical site was cauterized with phenol and washed with alcohol.  The site was bandaged with neosporin, sterile 2x2 and kling.  Home instructions given.   Helane Gunther DPM

## 2017-03-13 ENCOUNTER — Ambulatory Visit (INDEPENDENT_AMBULATORY_CARE_PROVIDER_SITE_OTHER): Admitting: Podiatry

## 2017-03-13 DIAGNOSIS — Z9889 Other specified postprocedural states: Secondary | ICD-10-CM

## 2017-03-13 NOTE — Progress Notes (Signed)
This patient returns to the office follow-up for skin surgery for the removal of 2 plantar warts, right foot. He says he has been soaking his foot and bandaging his foot as directed. He states there still mild tenderness noted at the site of the surgeries, but this is occasional in nature. He presents the office today for an evaluation and treatment of his right foot  GENERAL APPEARANCE: Alert, conversant. Appropriately groomed. No acute distress.  VASCULAR: Pedal pulses are  palpable at  [facility] and PT bilateral.  Capillary refill time is immediate to all digits,  Normal temperature gradient.  Digital hair growth is present bilateral  NEUROLOGIC: sensation is normal to 5.07 monofilament at 5/5 sites bilateral.  Light touch is intact bilateral, Muscle strength normal.  MUSCULOSKELETAL: acceptable muscle strength, tone and stability bilateral.  Intrinsic muscluature intact bilateral.  Rectus appearance of foot and digits noted bilateral.   DERMATOLOGIC: skin color, texture, and turgor are within normal limits.  No preulcerative lesions or ulcers  are seen, no interdigital maceration noted.  No open lesions present.  Digital nails are asymptomatic. No drainage noted. The surgical sites have normal granulation tissue noted in the absence of any infection or drainage   S/P skin surgery  ROV  examine the surgical sites, and normal healing is progressing nicely. Patient to return to the office of problems occur. Return to clinic when necessary   Helane Gunther DPM

## 2017-04-21 ENCOUNTER — Ambulatory Visit (INDEPENDENT_AMBULATORY_CARE_PROVIDER_SITE_OTHER): Admitting: Podiatry

## 2017-04-21 ENCOUNTER — Encounter: Payer: Self-pay | Admitting: Podiatry

## 2017-04-21 DIAGNOSIS — L989 Disorder of the skin and subcutaneous tissue, unspecified: Secondary | ICD-10-CM | POA: Diagnosis not present

## 2017-04-21 DIAGNOSIS — B070 Plantar wart: Secondary | ICD-10-CM

## 2017-04-21 NOTE — Progress Notes (Signed)
This patient returns to the office follow-up for skin surgery for the removal of 2 plantar warts, right foot. He says he has been feeling pain at the site  smaller wart near the ball of his left foot.  There is no pain to the wart area removed near his heel.  He says there is pain during walking and believes the smaller wart has returned.  GENERAL APPEARANCE: Alert, conversant. Appropriately groomed. No acute distress.  VASCULAR: [facility]al [facility]  [facility]  [facility].  Capillary refill time is immediate to all digits,  Normal temperature gradient.  Digital hair growth is present bilateral  NEUROLOGIC: sensation is normal to 5.07 monofilament at 5/5 sites bilateral.  Light touch is intact bilateral, Muscle strength normal.  MUSCULOSKELETAL: acceptable muscle strength, tone and stability bilateral.  Intrinsic muscluature intact bilateral.  Rectus appearance of foot and digits noted bilateral.   DERMATOLOGIC: skin color, texture, and turgor are within normal limits.  No preulcerative lesions or ulcers  are seen, no interdigital maceration noted.  No open lesions present.  Digital nails are asymptomatic. No drainage noted. The surgical site near the heel has healed with no pain.  He has small cauliflower skin lesion proximal to ball of foot left.   S/P skin surgery Wart  Benign skin lesion.  ROV  examine the surgical sites, and normal healing is progressing nicely at the wart near heel.  Wart has regrown at the site of left forefoot.   Application of acid to site of wart left foot.  RTC 1 week. Patient to return to the office of problems occur. Return to clinic when necessary   Helane GuntherGregory Stormy Sabol DPM

## 2017-04-28 ENCOUNTER — Encounter: Payer: Self-pay | Admitting: Podiatry

## 2017-04-28 ENCOUNTER — Ambulatory Visit (INDEPENDENT_AMBULATORY_CARE_PROVIDER_SITE_OTHER): Admitting: Podiatry

## 2017-04-28 DIAGNOSIS — Z9889 Other specified postprocedural states: Secondary | ICD-10-CM | POA: Diagnosis not present

## 2017-04-28 NOTE — Progress Notes (Signed)
This patient returns to the office 1 week after the application of acid to the bottom of his right foot. He says there was a little discomfort the first night but since then it is healed and not cause any pain or discomfort. No pain at the wart near his heel. He presents the office for evaluation of the wart site and the acid application  GENERAL APPEARANCE: Alert, conversant. Appropriately groomed. No acute distress.  VASCULAR: Pedal [facility] are  [facility]  [facility].  Capillary refill time is immediate to all digits,  Normal temperature gradient.  Digital hair growth is present bilateral  NEUROLOGIC: sensation is normal to 5.07 monofilament at 5/5 sites bilateral.  Light touch is intact bilateral, Muscle strength normal.  MUSCULOSKELETAL: acceptable muscle strength, tone and stability bilateral.  Intrinsic muscluature intact bilateral.  Rectus appearance of foot and digits noted bilateral.   DERMATOLOGIC: skin color, texture, and turgor are within normal limits.  no interdigital maceration noted.  No open lesions present.  Digital nails are asymptomatic. No drainage noted. Examination of the wart site does reveal healing and desquamation noted at the site of the previous wart mild redness noted at this site of the application of the acid no pain is noted at this time.  S/P acid application right foot  ROV. Examination of his foot does reveal healing noted at the site of the acid application. This is healing nicely with no pain, discomfort. Patient was told to return to the office if the problem continues.  He says the acid treatment seems to have been easier than the actual skin lesion excision   Helane GuntherGregory Tynisa Vohs DPM

## 2017-06-23 ENCOUNTER — Encounter: Payer: Self-pay | Admitting: Internal Medicine

## 2017-06-23 ENCOUNTER — Ambulatory Visit (INDEPENDENT_AMBULATORY_CARE_PROVIDER_SITE_OTHER): Admitting: Internal Medicine

## 2017-06-23 VITALS — BP 138/86 | HR 82 | Ht 73.0 in | Wt 231.0 lb

## 2017-06-23 DIAGNOSIS — R197 Diarrhea, unspecified: Secondary | ICD-10-CM

## 2017-06-23 DIAGNOSIS — K625 Hemorrhage of anus and rectum: Secondary | ICD-10-CM | POA: Diagnosis not present

## 2017-06-23 MED ORDER — METRONIDAZOLE 500 MG PO TABS: 500.0000 mg | ORAL_TABLET | Freq: Two times a day (BID) | ORAL | 0 refills | Status: DC

## 2017-06-23 NOTE — Progress Notes (Signed)
Date:  06/23/2017   Name:  Jeffery Collins   DOB:  01/29/1971   MRN:  [id number]   Chief Complaint: Diarrhea (Happening for several months. Having loose/ watery stools. Diet has not changed. Drink lots of water and taking fiber supplements. Seen blood in stool this morning. ) Diarrhea   This is a new problem. The current episode started more than 1 month ago. The problem occurs 2 to 4 times per day. The problem has been unchanged. The stool consistency is described as blood tinged and watery (just noted blood today). Associated symptoms include abdominal pain and increased flatus. Pertinent negatives include no arthralgias, bloating, chills, coughing, fever, vomiting or weight loss. He has tried increased fluids for the symptoms. The treatment provided no relief.  He has taken some Pepto the last few days with no change.  He is no longer taking omeprazole.  Wt Readings from Last 3 Encounters:  06/23/17 231 lb (104.8 kg)  02/20/17 238 lb (108 kg)  01/01/17 234 lb (106.1 kg)     Review of Systems  Constitutional: Negative for chills, fatigue, fever, unexpected weight change and weight loss.  Respiratory: Negative for cough and chest tightness.   Cardiovascular: Negative for chest pain.  Gastrointestinal: Positive for abdominal pain, anal bleeding, diarrhea and flatus. Negative for bloating and vomiting.  Genitourinary: Negative for difficulty urinating.  Musculoskeletal: Negative for arthralgias.  Neurological: Negative for dizziness and numbness.    Patient Active Problem List   Diagnosis Date Noted  . Elevated blood pressure reading 11/26/2016  . Impingement syndrome of right shoulder 10/18/2015  . Gastro-esophageal reflux disease without esophagitis 08/16/2015  . Plantar fasciitis 08/16/2015  . Psoriasis of scalp 08/16/2015  . Calculus of kidney 02/07/2015    Prior to Admission medications   Medication Sig Start Date End Date Taking? Authorizing Provider    cyclobenzaprine (FLEXERIL) 5 MG tablet  01/10/17  Yes [provider]      Yes [provider]  triamcinolone (KENALOG) 0.025 % ointment Apply 1 application topically 2 (two) times daily. 11/26/16  Yes Reubin MilanBerglund, Lendora Keys H, MD    No Known Allergies  Past Surgical History:  Procedure Laterality Date  . COLONOSCOPY  2005  . LITHOTRIPSY  02/2014    Social History  Substance Use Topics  . Smoking status: Never Smoker  . Smokeless tobacco: Never Used  . Alcohol use 1.2 oz/week    2 Standard drinks or equivalent per week     Medication list has been reviewed and updated.   Physical Exam  Constitutional: He is oriented to person, place, and time. He appears well-developed. No distress.  HENT:  Head: Normocephalic and atraumatic.  Cardiovascular: Normal rate, regular rhythm and normal heart sounds.   Pulmonary/Chest: Effort normal and breath sounds normal. No respiratory distress. He has no wheezes.  Abdominal: Soft. He exhibits no mass. Bowel sounds are decreased. There is generalized tenderness. There is no rigidity, no rebound, no guarding and no CVA tenderness.  Musculoskeletal: Normal range of motion.  Neurological: He is alert and oriented to person, place, and time.  Skin: Skin is warm and dry. No rash noted.  Psychiatric: He has a normal mood and affect. His behavior is normal. Thought content normal.  Nursing note and vitals reviewed.   BP 138/86   Pulse 82   Ht 6\' 1"  (1.854 m)   Wt 231 lb (104.8 kg)   SpO2 97%   BMI 30.48 kg/m   Assessment and Plan: 1.  Diarrhea of presumed infectious origin Treat for possible giardia - Comprehensive metabolic panel - metroNIDAZOLE (FLAGYL) 500 MG tablet; Take 1 tablet (500 mg total) by mouth 2 (two) times daily.  Dispense: 14 tablet; Refill: 0 - Ambulatory referral to Gastroenterology  2. Rectal bleeding Need GI consultation for possible colonoscopy - CBC with Differential/Platelet - Ambulatory referral to  Gastroenterology   Meds ordered this encounter  Medications  . metroNIDAZOLE (FLAGYL) 500 MG tablet    Sig: Take 1 tablet (500 mg total) by mouth 2 (two) times daily.    Dispense:  14 tablet    Refill:  0    Bari Edward, MD [facility]  06/23/2017

## 2017-06-24 LAB — CBC WITH DIFFERENTIAL/PLATELET
BASOS: 0 %
Basophils Absolute: 0 10*3/uL (ref 0.0–0.2)
EOS (ABSOLUTE): 0.1 10*3/uL (ref 0.0–0.4)
EOS: 2 %
HEMATOCRIT: 41.8 % (ref 37.5–51.0)
Hemoglobin: 14.8 g/dL (ref 13.0–17.7)
IMMATURE GRANS (ABS): 0 10*3/uL (ref 0.0–0.1)
Immature Granulocytes: 1 %
Lymphocytes Absolute: 2.1 10*3/uL (ref 0.7–3.1)
Lymphs: 34 %
MCH: 29.2 pg (ref 26.6–33.0)
MCHC: 35.4 g/dL (ref 31.5–35.7)
MCV: 82 fL (ref 79–97)
MONOS ABS: 0.4 10*3/uL (ref 0.1–0.9)
Monocytes: 6 %
NEUTROS ABS: 3.6 10*3/uL (ref 1.4–7.0)
Neutrophils: 57 %
PLATELETS: 209 10*3/uL (ref 150–379)
RBC: 5.07 x10E6/uL (ref 4.14–5.80)
RDW: 14.6 % (ref 12.3–15.4)
WBC: 6.2 10*3/uL (ref 3.4–10.8)

## 2017-06-24 LAB — COMPREHENSIVE METABOLIC PANEL
A/G RATIO: 1.9 (ref 1.2–2.2)
ALT: 23 IU/L (ref 0–44)
AST: 21 IU/L (ref 0–40)
Albumin: 4.9 g/dL (ref 3.5–5.5)
Alkaline Phosphatase: 108 IU/L (ref 39–117)
BILIRUBIN TOTAL: 0.4 mg/dL (ref 0.0–1.2)
BUN / CREAT RATIO: 15 (ref 9–20)
BUN: 11 mg/dL (ref 6–24)
CO2: 25 mmol/L (ref 20–29)
Calcium: 10.3 mg/dL — ABNORMAL HIGH (ref 8.7–10.2)
Chloride: 100 mmol/L (ref 96–106)
Creatinine, Ser: 0.75 mg/dL — ABNORMAL LOW (ref 0.76–1.27)
GFR calc Af Amer: 128 mL/min/{1.73_m2} (ref >59)
GFR, EST NON AFRICAN AMERICAN: 111 mL/min/{1.73_m2} (ref >59)
GLOBULIN, TOTAL: 2.6 g/dL (ref 1.5–4.5)
Glucose: 80 mg/dL (ref 65–99)
POTASSIUM: 4.1 mmol/L (ref 3.5–5.2)
SODIUM: 143 mmol/L (ref 134–144)
TOTAL PROTEIN: 7.5 g/dL (ref 6.0–8.5)

## 2017-07-25 ENCOUNTER — Ambulatory Visit: Payer: Self-pay | Admitting: Gastroenterology

## 2017-08-25 ENCOUNTER — Other Ambulatory Visit: Payer: Self-pay | Admitting: Internal Medicine

## 2017-08-25 ENCOUNTER — Encounter: Payer: Self-pay | Admitting: Gastroenterology

## 2017-08-25 ENCOUNTER — Ambulatory Visit (INDEPENDENT_AMBULATORY_CARE_PROVIDER_SITE_OTHER): Admitting: Gastroenterology

## 2017-08-25 VITALS — BP 136/91 | HR 82 | Temp 98.2°F | Ht 73.0 in | Wt 236.8 lb

## 2017-08-25 DIAGNOSIS — M5430 Sciatica, unspecified side: Secondary | ICD-10-CM | POA: Insufficient documentation

## 2017-08-25 DIAGNOSIS — K589 Irritable bowel syndrome without diarrhea: Secondary | ICD-10-CM | POA: Insufficient documentation

## 2017-08-25 DIAGNOSIS — L409 Psoriasis, unspecified: Secondary | ICD-10-CM

## 2017-08-25 DIAGNOSIS — M545 Low back pain, unspecified: Secondary | ICD-10-CM | POA: Insufficient documentation

## 2017-08-25 DIAGNOSIS — K580 Irritable bowel syndrome with diarrhea: Secondary | ICD-10-CM

## 2017-08-25 MED ORDER — AMITRIPTYLINE HCL 25 MG PO TABS: 50.0000 mg | ORAL_TABLET | Freq: Every day | ORAL | 0 refills | Status: DC

## 2017-08-25 NOTE — Progress Notes (Signed)
Jeffery Repress, MD [address]  [address]  [place], [place] [address]  Main: [phone]  Fax: [phone]    Gastroenterology Consultation  Referring Provider:     Reubin Milan, MD Primary Care Physician:  Reubin Milan, MD Primary Gastroenterologist:  Dr. Arlyss Collins Reason for Consultation:     Altered bowel habits        HPI:   Jeffery Collins is a 46 y.o. y/o male referred by Dr. Judithann Graves, Nyoka Cowden, MD  for consultation & management of Altered bowel habits. He has been experiencing bouts of abdominal cramps, non bloody diarrhea, 4-5 bowel movements a variable consistency anywhere from formed to watery. This has been going on since 1-2 years, occurs every week. Each episode lasts for 1-2 days, spends 5-81min on toilet. He also experiences abdominal cramps after the bowel movement. In between these episodes, he reports being asymptomatic, has regular BMs. He denies nocturnal diarrhea or incontinence or fecal urgency. He was an Teaching laboratory technician about 2-3 years ago and was asymptomatic or would have occasional similar symptoms. 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 Has to eat outside food, not physically active as before, drinks carbonated beverages 12-36 ounces daily. He thinks change in his lifestyle has been the reason for above symptoms. He is going back to be an Teaching laboratory technician in January 2019. He also has depression and currently not treated, feels stressed. He is looking into see a psychiatrist before he joins duty. His weight fluctuates between 231 and 238 pounds. He denies any upper GI symptoms. He has psoriasis of the scalp which is controlled with topical triamcinolone. He has mild hypercalcemia and PTH normal in 01/2015.  He does not smoke, drinks alcohol occasionally. Denies any illicit drug use including marijuana. He did not have any GI surgeries He does not  know about his family history as he is not in contact with them for a long time GI Procedures: Had a colonoscopy at age 33yrs secondary to blood in stool and reportedly normal  No past medical history on file.  Past Surgical History:  Procedure Laterality Date  . COLONOSCOPY  2005  . LITHOTRIPSY  02/2014    Prior to Admission medications   Medication Sig Start Date End Date Taking? Authorizing Provider  triamcinolone (KENALOG) 0.025 % ointment Apply 1 application topically 2 (two) times daily. 11/26/16  Yes Reubin Milan, MD    Family History  Problem Relation Age of Onset  . Diabetes Mother   . Lung cancer Father      Social History  Substance Use Topics  . Smoking status: Former Smoker    Types: Cigarettes  . Smokeless tobacco: Never Used     Comment: stopped 2000  . Alcohol use 1.2 oz/week    2 Standard drinks or equivalent per week    Allergies as of 08/25/2017  . (No Known Allergies)    Review of Systems:    All systems reviewed and negative except where noted in HPI.   Physical Exam:  BP (!) 136/91   Pulse 82   Temp 98.2 F (36.8 C) (Oral)   Ht  (1.854 m)   Wt 236 lb 12.8 oz (107.4 kg)   BMI 31.24 kg/m  No LMP for male patient.  General:   Alert,  Well-developed, well-nourished, pleasant and cooperative in NAD Head:  Normocephalic and atraumatic, psoriatic rash  on anterior scalp. Eyes:  Sclera clear, no icterus.   Conjunctiva pink. Ears:  Normal auditory acuity. Nose:  No deformity, discharge, or lesions. Mouth:  No deformity or lesions,oropharynx pink & moist. Neck:  Supple; no masses or thyromegaly. Lungs:  Respirations even and unlabored.  Clear throughout to auscultation.   No wheezes, crackles, or rhonchi. No acute distress. Heart:  Regular rate and rhythm; no murmurs, clicks, rubs, or gallops. Abdomen:  Normal bowel sounds.  No bruits.  Soft, non-tender and non-distended without masses, hepatosplenomegaly or hernias noted.  No guarding or  rebound tenderness.   Rectal: Nor performed Msk:  Symmetrical without gross deformities. Good, equal movement & strength bilaterally. Pulses:  Normal pulses noted. Extremities:  No clubbing or edema.  No cyanosis. Neurologic:  Alert and oriented x3;  grossly normal neurologically. Skin:  Intact without significant lesions or rashes. No jaundice. Lymph Nodes:  No significant cervical adenopathy. Psych:  Alert and cooperative. Normal mood and affect.  Imaging Studies: Reviewed abdominal imaging from 02/2014  Assessment and Plan:   Jeffery Collins is a 46 y.o. male with Chronic intermittent episodes of abdominal cramps and nonbloody diarrhea, untreated depression and with no constitutional symptoms. He has no evidence of anemia. 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  - Start amitriptyline 25 mg at bedtime, increase to 50 mg daily at bedtime in 2 weeks if able to tolerate - Avoid carbonated beverages - Incorporate exercise and healthy diet - If symptoms persist or not improving despite above measures, recommend further workup and colonoscopy  Follow up in 4 weeks   Jeffery Repress, MD

## 2017-09-23 ENCOUNTER — Other Ambulatory Visit: Payer: Self-pay

## 2017-09-23 ENCOUNTER — Ambulatory Visit (INDEPENDENT_AMBULATORY_CARE_PROVIDER_SITE_OTHER): Admitting: Gastroenterology

## 2017-09-23 ENCOUNTER — Encounter: Payer: Self-pay | Admitting: Gastroenterology

## 2017-09-23 DIAGNOSIS — K580 Irritable bowel syndrome with diarrhea: Secondary | ICD-10-CM | POA: Diagnosis not present

## 2017-09-23 MED ORDER — AMITRIPTYLINE HCL 25 MG PO TABS: 50.0000 mg | ORAL_TABLET | Freq: Every day | ORAL | 0 refills | Status: DC

## 2017-09-23 NOTE — Progress Notes (Signed)
Arlyss Repressohini R Vanga, MD [address]  [address]  [place], [place] [address]  Main: [phone]  Fax: [phone]    Gastroenterology Consultation  Referring Provider:     Reubin MilanBerglund, Laura H, MD Primary Care Physician:  Reubin MilanBerglund, Laura H, MD Primary Gastroenterologist:  Dr. Arlyss Repressohini R Vanga Reason for Consultation:     Altered bowel habits        HPI:   Jeffery Collins is a 46 y.o. y/o male referred by Dr. Judithann GravesBerglund, Nyoka CowdenLaura H, MD  for consultation & management of Altered bowel habits. He has been experiencing bouts of abdominal cramps, non bloody diarrhea, 4-5 bowel movements a variable consistency anywhere from formed to watery. This has been going on since 1-2 years, occurs every week. Each episode lasts for 1-2 days, spends 5-8415min on toilet. He also experiences abdominal cramps after the bowel movement. In between these episodes, he reports being asymptomatic, has regular BMs. He denies nocturnal diarrhea or incontinence or fecal urgency. He was an Teaching laboratory technicianactive military about 2-3 years ago and was asymptomatic or would have occasional similar symptoms. 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 Has to eat outside food, not physically active as before, drinks carbonated beverages 12-36 ounces daily. He thinks change in his lifestyle has been the reason for above symptoms. 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 He also has depression and currently not treated, feels stressed. He is looking into see a psychiatrist before he joins duty. His weight fluctuates between 231 and 238 pounds. He denies any upper GI symptoms. He has psoriasis of the scalp which is controlled with topical triamcinolone. He has mild hypercalcemia and PTH normal in 01/2015.  He does not smoke, drinks alcohol occasionally. Denies any illicit drug use including marijuana. He did not have any GI surgeries He does not  know about his family history as he is not in contact with them for a long time GI Procedures: Had a colonoscopy at age [age]yrs secondary to blood in stool and reportedly normal  Follow up visit 09/2017: Symptoms resolved after avoiding carborated beverages, exercising more and also on amitriptyline 25mg  at bedtime. Having 1-2 formed BMs daily. [place] training. Denies any other complaints/concerns   History reviewed. No pertinent past medical history.  Past Surgical History:  Procedure Laterality Date  . COLONOSCOPY  2005  . LITHOTRIPSY  02/2014    Prior to Admission medications   Medication Sig Start Date End Date Taking? Authorizing Provider  triamcinolone (KENALOG) 0.025 % ointment Apply 1 application topically 2 (two) times daily. 11/26/16  Yes Reubin MilanBerglund, Laura H, MD    Family History  Problem Relation Age of Onset  . Diabetes Mother   . Lung cancer Father      Social History   Tobacco Use  . Smoking status: Former Smoker    Types: Cigarettes  . Smokeless tobacco: Never Used  . Tobacco comment: stopped 2000  Substance Use Topics  . Alcohol use: Yes    Alcohol/week: 1.2 oz    Types: 2 Standard drinks or equivalent per week  . Drug use: No    Allergies as of 09/23/2017  . (No Known Allergies)    Review of Systems:    All systems reviewed and negative except where noted in HPI.   Physical Exam:  BP 135/80 (BP Location: Right Arm, Patient Position:  Sitting, Cuff Size: Normal)   Pulse 80   Temp 98.2 F (36.8 C) (Oral)  No LMP for male patient.  General:   Alert,  Well-developed, well-nourished, pleasant and cooperative in NAD Head:  Normocephalic and atraumatic, psoriatic rash on anterior scalp. Eyes:  Sclera clear, no icterus.   Conjunctiva pink. Ears:  Normal auditory acuity. Nose:  No deformity, discharge, or lesions. Mouth:  No deformity or lesions,oropharynx pink & moist. Neck:  Supple; no masses or thyromegaly. Lungs:   Respirations even and unlabored.  Clear throughout to auscultation.   No wheezes, crackles, or rhonchi. No acute distress. Heart:  Regular rate and rhythm; no murmurs, clicks, rubs, or gallops. Abdomen:  Normal bowel sounds.  No bruits.  Soft, non-tender and non-distended without masses, hepatosplenomegaly or hernias noted.  No guarding or rebound tenderness.   Rectal: Nor performed Msk:  Symmetrical without gross deformities. Good, equal movement & strength bilaterally. Pulses:  Normal pulses noted. Extremities:  No clubbing or edema.  No cyanosis. Neurologic:  Alert and oriented x3;  grossly normal neurologically. Skin:  Intact without significant lesions or rashes. No jaundice. Lymph Nodes:  No significant cervical adenopathy. Psych:  Alert and cooperative. Normal mood and affect.  Imaging Studies: Reviewed abdominal imaging from 02/2014  Assessment and Plan:   Jeffery Collins is a 46 y.o. male with Chronic intermittent episodes of abdominal cramps and nonbloody diarrhea, untreated depression and with no constitutional symptoms. He has no evidence of anemia. 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 Here for f/u, IBS-D controlled currently   - Continue amitriptyline 25 mg at bedtime, he is tolerating it well, increase to 50 mg before going for training - Continue to avoid carbonated beverages - Continue exercise and healthy diet - Recommend screening colonoscopy at age 46  Follow up in 3months   Arlyss Repressohini R Vanga, MD

## 2017-12-22 ENCOUNTER — Other Ambulatory Visit: Payer: Self-pay | Admitting: Gastroenterology

## 2017-12-22 DIAGNOSIS — K580 Irritable bowel syndrome with diarrhea: Secondary | ICD-10-CM

## 2018-02-24 ENCOUNTER — Ambulatory Visit: Admitting: Gastroenterology

## 2018-10-09 ENCOUNTER — Telehealth: Payer: Self-pay

## 2018-10-09 NOTE — Telephone Encounter (Signed)
Agree with advice

## 2018-10-09 NOTE — Telephone Encounter (Signed)
Patient having ear pain after falling asleep with headphones on. He can not come in today due to work and wanted some advise on what to do to help the irritation. Advised warm compresses and antiinflammatory. [facility] are severe [facility] over weekend. He will take Ibuprofen or Advil and he confirmed [facility] Will call if any changes

## 2018-10-12 ENCOUNTER — Other Ambulatory Visit: Payer: Self-pay | Admitting: Internal Medicine

## 2018-10-12 DIAGNOSIS — L409 Psoriasis, unspecified: Secondary | ICD-10-CM

## 2019-02-02 ENCOUNTER — Encounter: Admitting: Internal Medicine

## 2019-02-23 ENCOUNTER — Encounter: Admitting: Internal Medicine

## 2019-03-24 ENCOUNTER — Other Ambulatory Visit: Payer: Self-pay | Admitting: Internal Medicine

## 2019-03-24 DIAGNOSIS — L409 Psoriasis, unspecified: Secondary | ICD-10-CM

## 2019-03-25 NOTE — Telephone Encounter (Signed)
Mailbox is full could not LM 

## 2019-07-08 ENCOUNTER — Encounter: Payer: Self-pay | Admitting: Internal Medicine

## 2019-08-05 ENCOUNTER — Emergency Department

## 2019-08-05 ENCOUNTER — Emergency Department
Admission: EM | Admit: 2019-08-05 | Discharge: 2019-08-05 | Disposition: A | Discharge status: Home / Self Care | Attending: Emergency Medicine | Admitting: Emergency Medicine

## 2019-08-05 ENCOUNTER — Other Ambulatory Visit: Payer: Self-pay

## 2019-08-05 DIAGNOSIS — R51 Headache: Secondary | ICD-10-CM | POA: Diagnosis not present

## 2019-08-05 DIAGNOSIS — Z87891 Personal history of nicotine dependence: Secondary | ICD-10-CM | POA: Insufficient documentation

## 2019-08-05 DIAGNOSIS — Z79899 Other long term (current) drug therapy: Secondary | ICD-10-CM | POA: Diagnosis not present

## 2019-08-05 DIAGNOSIS — S39012A Strain of muscle, fascia and tendon of lower back, initial encounter: Secondary | ICD-10-CM | POA: Diagnosis not present

## 2019-08-05 DIAGNOSIS — Y999 Unspecified external cause status: Secondary | ICD-10-CM | POA: Insufficient documentation

## 2019-08-05 DIAGNOSIS — S3992XA Unspecified injury of lower back, initial encounter: Secondary | ICD-10-CM | POA: Diagnosis present

## 2019-08-05 DIAGNOSIS — M542 Cervicalgia: Secondary | ICD-10-CM | POA: Diagnosis not present

## 2019-08-05 DIAGNOSIS — Y92410 Unspecified street and highway as the place of occurrence of the external cause: Secondary | ICD-10-CM | POA: Diagnosis not present

## 2019-08-05 DIAGNOSIS — Y93I9 Activity, other involving external motion: Secondary | ICD-10-CM | POA: Insufficient documentation

## 2019-08-05 MED ORDER — KETOROLAC TROMETHAMINE 30 MG/ML IJ SOLN
30.0000 mg | Freq: Once | INTRAMUSCULAR | Status: AC
  Administered 2019-08-05: 30 mg via INTRAMUSCULAR
  Filled 2019-08-05: qty 1

## 2019-08-05 MED ORDER — ACETAMINOPHEN 500 MG PO TABS
1000.0000 mg | ORAL_TABLET | Freq: Once | ORAL | Status: AC
  Administered 2019-08-05: 22:00:00 1000 mg via ORAL
  Filled 2019-08-05: qty 2

## 2019-08-05 MED ORDER — CYCLOBENZAPRINE HCL 5 MG PO TABS: 5.0000 mg | ORAL_TABLET | Freq: Three times a day (TID) | ORAL | 0 refills | Status: AC | PRN

## 2019-08-05 NOTE — ED Notes (Signed)
Pt sitting in bed speaking with this RN in NAD, A&Ox4. Reports pain 5/10 in shoulders, lower back and right side.
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
## 2019-08-05 NOTE — Discharge Instructions (Addendum)
° °  No evidence of acute fracture to the thoracic spine.  Take Tylenol 1 g every 8 hours and ibuprofen 600 every 6 hours.  Take the Flexeril to help with severe pain.  Try to take this at nighttime at first in case it makes you sleepy.  Do not drive while taking this.  Your CT does show signs of a small herniated disc.  You can follow-up with neurosurgery for this if you pain is not getting better.  Return to the ER with difficulty walking or any other concerns   1. No acute abnormality of the lumbar spine. 2. Small calcified central disc protrusion at L5-S1 without neural impingement. 3. Small bilateral renal calculi.   1.5 cm sclerotic lesion within the T5 vertebra. This is nonspecific, but may reflect a bone island

## 2019-08-05 NOTE — ED Notes (Signed)
c-collar placed on patient.

## 2020-01-30 ENCOUNTER — Other Ambulatory Visit: Payer: Self-pay | Admitting: Internal Medicine

## 2020-01-30 DIAGNOSIS — L409 Psoriasis, unspecified: Secondary | ICD-10-CM
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
## 2020-06-21 ENCOUNTER — Ambulatory Visit: Payer: Self-pay

## 2020-06-21 NOTE — Telephone Encounter (Signed)
Patient called and says he thinks he may have been exposed to either poison oak or ivy while working in the garden. He says 2 days ago he developed a rash under the left arm and on the top of both feet. He says the rash is red, puffy, scaly and swollen a little on the feet from the shoes rubbing. He says the areas itch mild-moderately and hydrocortisone helps some. He denies pain or any other symptoms. I advised no availability on the schedule for the next 2 days. I advised I will send this over to the office, since they are now closed, to check if he is able to be seen in the office or he could go to the UC. He says he will wait to hear from the office and if no call tomorrow, he will go to the UC, but prefers the office first. Care advice given, patient verbalized understanding.  Reason for Disposition . SEVERE itching (e.g., interferes with sleep or normal activities)  Answer Assessment - Initial Assessment Questions 1. APPEARANCE of RASH: "Describe the rash."      Red, puffy, scaly, swollen on feet 2. LOCATION: "Where is the rash located?"      Top of both feet, under the left arm 3. SIZE: "How large is the rash?"      Silver dollar, dime size on feet 4. ONSET: "When did the rash begin?"      2 days ago 5. ITCHING: "Does the rash itch?" If Yes, ask: "How bad is it?"   - MILD - doesn't interfere with normal activities   - MODERATE-SEVERE: interferes with work, school, sleep, or other activities      Mild-Moderate 6. PREGNANCY: "Is there any chance you are pregnant?" "When was your last menstrual period?"     N/A  Protocols used: POISON IVY - OAK - SUMAC-A-AH

## 2020-06-22 NOTE — Telephone Encounter (Signed)
Pt at urgent care.  

## 2021-01-03 ENCOUNTER — Other Ambulatory Visit: Payer: Self-pay | Admitting: Internal Medicine

## 2021-01-03 DIAGNOSIS — L409 Psoriasis, unspecified: Secondary | ICD-10-CM

## 2021-01-03 NOTE — Telephone Encounter (Signed)
Requested medications are due for refill today yes  Requested medications are on the active medication list yes  Last refill 01/2020  Last visit More than a year ago  Notes to clinic Failed protocol due to no valid visit within 12  months.

## 2021-02-13 ENCOUNTER — Other Ambulatory Visit: Payer: Self-pay | Admitting: Internal Medicine

## 2021-02-13 DIAGNOSIS — L409 Psoriasis, unspecified: Secondary | ICD-10-CM

## 2021-02-13 NOTE — Telephone Encounter (Signed)
Requested medications are due for refill today. yes  Requested medications are on the active medications list.  yes  Last refill. 01/2020  Future visit scheduled.   No  Notes to clinic.  Has not been seen for 3 years.

## 2021-02-14 NOTE — Telephone Encounter (Signed)
Contacted patient to explain he have not been seen in 3 years in order to establish care with dr berglund, he would need to set up a new patient appointment. I recommend that he should contact a dermatologist for his skin condition for refill on his medicine.

## 2022-04-16 ENCOUNTER — Encounter (HOSPITAL_COMMUNITY): Payer: Self-pay

## 2022-04-16 ENCOUNTER — Ambulatory Visit (HOSPITAL_COMMUNITY)
Admission: EM | Admit: 2022-04-16 | Discharge: 2022-04-16 | Disposition: A | Discharge status: Home / Self Care | Attending: Family Medicine | Admitting: Family Medicine

## 2022-04-16 DIAGNOSIS — M654 Radial styloid tenosynovitis [de Quervain]: Secondary | ICD-10-CM

## 2022-04-16 MED ORDER — DEXAMETHASONE SODIUM PHOSPHATE 10 MG/ML IJ SOLN
10.0000 mg | Freq: Once | INTRAMUSCULAR | Status: AC
  Administered 2022-04-16: 10 mg via INTRAMUSCULAR

## 2022-04-16 MED ORDER — DEXAMETHASONE SODIUM PHOSPHATE 10 MG/ML IJ SOLN
INTRAMUSCULAR | Status: AC
  Filled 2022-04-16: qty 1

## 2022-04-16 NOTE — Discharge Instructions (Signed)
Meds ordered this encounter  Medications   dexamethasone (DECADRON) injection 10 mg    

## 2022-04-16 NOTE — ED Triage Notes (Signed)
Right hand  pain in the ball of the hand with use. Shooting pain. X-ray was done and came back normal. Given a brace and over the counter tylenol for pain.   Still unable to grab and hold things with the right hand. Pain continued a week ago.
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
## 2022-04-23 ENCOUNTER — Ambulatory Visit (INDEPENDENT_AMBULATORY_CARE_PROVIDER_SITE_OTHER): Admitting: Orthopedic Surgery

## 2022-04-23 ENCOUNTER — Encounter: Payer: Self-pay | Admitting: Orthopedic Surgery

## 2022-04-23 ENCOUNTER — Ambulatory Visit (INDEPENDENT_AMBULATORY_CARE_PROVIDER_SITE_OTHER)

## 2022-04-23 VITALS — BP 148/104 | HR 74 | Ht 73.0 in | Wt 248.0 lb

## 2022-04-23 DIAGNOSIS — M65311 Trigger thumb, right thumb: Secondary | ICD-10-CM | POA: Diagnosis not present

## 2022-04-23 DIAGNOSIS — M79644 Pain in right finger(s): Secondary | ICD-10-CM | POA: Diagnosis not present

## 2022-04-23 MED ORDER — BETAMETHASONE SOD PHOS & ACET 6 (3-3) MG/ML IJ SUSP
6.0000 mg | INTRAMUSCULAR | Status: AC | PRN
  Administered 2022-04-23: 6 mg via INTRA_ARTICULAR

## 2022-04-23 MED ORDER — LIDOCAINE HCL 1 % IJ SOLN
1.0000 mL | INTRAMUSCULAR | Status: AC | PRN
  Administered 2022-04-23: 1 mL

## 2022-04-23 NOTE — Progress Notes (Signed)
Office Visit Note   Patient: Jeffery Collins           Date of Birth: 05-22-1971           MRN: [id number] Visit Date: 04/23/2022              Requested by: Vanessa Kick, MD [address] [place],  [place] [address] PCP: Pcp, No   Assessment & Plan: Visit Diagnoses:  1. Pain of right thumb   2. Trigger thumb, right thumb     Plan: Discussed with patient that his history and exam findings seem consistent with a trigger thumb on the right side.  He notes locking of the IP joint in a flexed position.  He is tender to palpation at the A1 pulley with some mild crepitus with IP range of motion.  We discussed the nature of trigger thumb as well as diagnosis, prognosis, both conservative and surgical treatment options.  After discussion, he would like to proceed with a corticosteroid injection of the right thumb A1 pulley.  I will see him back in 6 weeks to see if he is having symptom improvement.  Follow-Up Instructions: No follow-ups on file.   Orders:  Orders Placed This Encounter  Procedures   XR Finger Thumb Right   No orders of the defined types were placed in this encounter.     Procedures: Hand/UE Inj: R thumb A1 for trigger finger on 04/23/2022 9:57 AM Indications: tendon swelling and therapeutic Details: 25 G needle, volar approach Medications: 1 mL lidocaine 1 %; 6 mg betamethasone acetate-betamethasone sodium phosphate 6 (3-3) MG/ML Procedure, treatment alternatives, risks and benefits explained, specific risks discussed. Consent was given by the patient. Immediately prior to procedure a time out was called to verify the correct patient, procedure, equipment, support staff and site/side marked as required. Patient was prepped and draped in the usual sterile fashion.      Clinical Data: No additional findings.   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  This started in March when he was struck on the right thumb by a another bending machine.  He has since described pain at the thumb MCP joint.  He was seen in urgent care on the same time and notes that x-rays of the time were negative.  He notes that his right thumb will become locked in a flexed position at the IP joint.  This is painful for him.  This seems to be worse in the morning where he will often wake up with the thumb in a locked position.  His pain is worse with activity.  When asked to describe the location of the pain he points to the volar aspect of the MCP joint.  He been wearing a thumb spica brace and has no pain at rest in the brace.  He is having significant difficulty with his [place] obligations secondary to thumb pain.   Review of Systems   Objective: Vital Signs: BP (!) 148/104 (BP Location: Left Arm, Patient Position: Sitting)   Pulse 74   Ht _0  (1.854 m)   Wt 248 lb (112.5 kg)   BMI 32.72 kg/m   Physical Exam Constitutional:  Appearance: Normal appearance.  Cardiovascular:     Rate and Rhythm: Normal rate.     Pulses: Normal pulses.  Pulmonary:     Effort: Pulmonary effort is normal.  Skin:    General: Skin is warm and dry.     Capillary Refill: Capillary refill takes less than 2 seconds.  Neurological:     Mental Status: He is alert.    Right Hand Exam   Tenderness  Right hand tenderness location: TTP at volar aspect of thumb at MCP joint/A1 pulley.  Other  Erythema: absent Sensation: normal Pulse: present  Comments:  No TTP over [facility] joint w/ negative grind test.  No TTP over radial styloid and first dorsal compartment.  TTP at A1 pulley w/ palpable nodule and palpable crepitus with ROM of IP joint.  No radial/ulnar laxity at MCPJ in flexion or extension.      Specialty  Comments:  No specialty comments available.  Imaging: No results found.   PMFS History: Patient Active Problem List   Diagnosis Date Noted   Trigger thumb, right thumb 04/23/2022   Low back pain 08/25/2017   Sciatica 08/25/2017   IBS (irritable bowel syndrome) 08/25/2017   Elevated blood pressure reading 11/26/2016   Impingement syndrome of right shoulder 10/18/2015   Gastro-esophageal reflux disease without esophagitis 08/16/2015   Plantar fasciitis 08/16/2015   Psoriasis of scalp 08/16/2015   Calculus of kidney 02/07/2015   No past medical history on file.  Family History  Problem Relation Age of Onset   Diabetes Mother    Lung cancer Father     Past Surgical History:  Procedure Laterality Date   COLONOSCOPY  2005   LITHOTRIPSY  02/2014   Social History   Occupational History   Not on file  Tobacco Use   Smoking status: Former    Types: Cigarettes   Smokeless tobacco: Never   Tobacco comments:    stopped 2000  Vaping Use   Vaping Use: Never used  Substance and Sexual Activity   Alcohol use: Yes    Alcohol/week: 2.0 standard drinks    Types: 2 Standard drinks or equivalent per week   Drug use: No   Sexual activity: Yes

## 2022-06-04 ENCOUNTER — Ambulatory Visit (INDEPENDENT_AMBULATORY_CARE_PROVIDER_SITE_OTHER): Admitting: Orthopedic Surgery

## 2022-06-04 DIAGNOSIS — M65311 Trigger thumb, right thumb: Secondary | ICD-10-CM

## 2022-06-04 NOTE — Progress Notes (Signed)
   Office Visit Note   Patient: Jeffery Collins           Date of Birth: 13-Sep-1971           MRN: [id number] Visit Date: 06/04/2022              Requested by: No referring provider defined for this encounter. PCP: Pcp, No   Assessment & Plan: Visit Diagnoses:  1. Trigger thumb, right thumb     Plan: Patient is now 6 weeks s/p right thumb A1 pulley injection.  He's doing great today.  He has no further triggering or issues with the thumb.  He can follow up as needed.   Follow-Up Instructions: No follow-ups on file.   Orders:  No orders of the defined types were placed in this encounter.  No orders of the defined types were placed in this encounter.     Procedures: No procedures performed   Clinical Data: No additional findings.   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  He was seen previously around 6 weeks ago for painful triggering in the right thumb.  He underwent corticosteroid action at that time.  His thumb symptoms have seemingly resolved.  He has no further locking or catching of the thumb.  He has recently changed jobs such that he now works in the office which she thinks is also helped his symptoms.    Review of Systems   Objective: Vital Signs: There were no vitals taken for this visit.  Physical Exam  Right Hand Exam   Tenderness  The patient is experiencing no tenderness.   Other  Erythema: absent Sensation: normal Pulse: present  Comments:  No palpable clicking, locking, or catching with thumb.       Specialty Comments:  No specialty comments available.  Imaging: No results found.   PMFS History: Patient Active Problem List   Diagnosis Date Noted   Trigger thumb, right thumb 04/23/2022   Low back pain 08/25/2017   Sciatica 08/25/2017   IBS (irritable  bowel syndrome) 08/25/2017   Elevated blood pressure reading 11/26/2016   Impingement syndrome of right shoulder 10/18/2015   Gastro-esophageal reflux disease without esophagitis 08/16/2015   Plantar fasciitis 08/16/2015   Psoriasis of scalp 08/16/2015   Calculus of kidney 02/07/2015   No past medical history on file.  Family History  Problem Relation Age of Onset   Diabetes Mother    Lung cancer Father     Past Surgical History:  Procedure Laterality Date   COLONOSCOPY  2005   LITHOTRIPSY  02/2014   Social History   Occupational History   Not on file  Tobacco Use   Smoking status: Former    Types: Cigarettes   Smokeless tobacco: Never   Tobacco comments:    stopped 2000  Vaping Use   Vaping Use: Never used  Substance and Sexual Activity   Alcohol use: Yes    Alcohol/week: 2.0 standard drinks of alcohol    Types: 2 Standard drinks or equivalent per week   Drug use: No   Sexual activity: Yes

## 2022-07-24 ENCOUNTER — Encounter: Payer: Self-pay | Admitting: Orthopaedic Surgery

## 2022-07-24 ENCOUNTER — Ambulatory Visit: Admitting: Orthopaedic Surgery

## 2022-07-24 DIAGNOSIS — M65311 Trigger thumb, right thumb: Secondary | ICD-10-CM | POA: Diagnosis not present

## 2022-07-24 NOTE — Progress Notes (Signed)
Office Visit Note   Patient: Jeffery Collins           Date of Birth: 05/01/1971           MRN: [id number] Visit Date: 07/24/2022              Requested by: No referring provider defined for this encounter. PCP: Pcp, No   Assessment & Plan: Visit Diagnoses:  1. Trigger thumb, right thumb     Plan: Jeffery Collins been seen by Dr. Frazier Butt for evaluation of right trigger thumb.  He received an injection 3 months ago with excellent relief of his pain and triggering.  The problem has recurred to the point where he is having compromise of his activities related to the triggering and the discomfort when it does trigger.  He wishes to proceed with surgery.  I discussed that treatment with him including outpatient nature of the surgery, local incision and bandaging for probably a week or 10 days.  He would like to proceed.  Have also discussed the possibility of recurrence of the triggering and even an infection or even some nerve irritation  Follow-Up Instructions: Return Schedule right trigger thumb release.   Orders:  No orders of the defined types were placed in this encounter.  No orders of the defined types were placed in this encounter.     Procedures: No procedures performed   Clinical Data: No additional findings.   Subjective: Chief Complaint  Patient presents with   Right Thumb - Follow-up    Thumb is triggering.  Was seen by Dr. Frazier Butt about 3 months ago for triggering of the right thumb.  Local cortisone injection made a big difference.  He has had recurrence of the triggering and wishes to proceed with surgical correction  HPI  Review of Systems   Objective: Vital Signs: There were no vitals taken for this visit.  Physical Exam Constitutional:      Appearance: He is well-developed.  Eyes:     Pupils: Pupils are equal, round, and reactive to light.  Pulmonary:     Effort: Pulmonary effort is normal.  Skin:    General: Skin is warm and dry.   Neurological:     Mental Status: He is alert and oriented to person, place, and time.  Psychiatric:        Behavior: Behavior normal.     Ortho Exam right hand with active triggering of the right thumb.  There is a mildly painful nodule on the palm of the hand at the level of the metacarpal phalangeal joint with some crepitation consistent with a tenosynovitis.  No swelling of the digit.  Neurologically intact.  Good capillary refill.  Specialty Comments:  No specialty comments available.  Imaging: No results found.   PMFS History: Patient Active Problem List   Diagnosis Date Noted   Trigger thumb, right thumb 04/23/2022   Low back pain 08/25/2017   Sciatica 08/25/2017   IBS (irritable bowel syndrome) 08/25/2017   Elevated blood pressure reading 11/26/2016   Impingement syndrome of right shoulder 10/18/2015   Gastro-esophageal reflux disease without esophagitis 08/16/2015   Plantar fasciitis 08/16/2015   Psoriasis of scalp 08/16/2015   Calculus of kidney 02/07/2015   History reviewed. No pertinent past medical history.  Family History  Problem Relation Age of Onset   Diabetes Mother    Lung cancer Father     Past Surgical History:  Procedure Laterality Date   COLONOSCOPY  2005   LITHOTRIPSY  02/2014   Social History   Occupational History   Not on file  Tobacco Use   Smoking status: Former    Types: Cigarettes   Smokeless tobacco: Never   Tobacco comments:    stopped 2000  Vaping Use   Vaping Use: Never used  Substance and Sexual Activity   Alcohol use: Yes    Alcohol/week: 2.0 standard drinks of alcohol    Types: 2 Standard drinks or equivalent per week   Drug use: No   Sexual activity: Yes     Valeria Batman, MD   Note - This record has been created using Animal nutritionist.  Chart creation errors have been sought, but may not always  have been located. Such creation errors do not reflect on  the standard of medical care.

## 2022-08-08 ENCOUNTER — Other Ambulatory Visit: Payer: Self-pay | Admitting: Orthopaedic Surgery

## 2022-08-08 ENCOUNTER — Encounter: Payer: Self-pay | Admitting: Internal Medicine

## 2022-08-08 DIAGNOSIS — M65311 Trigger thumb, right thumb: Secondary | ICD-10-CM

## 2022-08-08 MED ORDER — HYDROCODONE-ACETAMINOPHEN 5-325 MG PO TABS: 1.0000 | ORAL_TABLET | Freq: Four times a day (QID) | ORAL | 0 refills | Status: DC | PRN

## 2022-08-15 ENCOUNTER — Ambulatory Visit: Admitting: Urology

## 2022-08-15 ENCOUNTER — Ambulatory Visit (INDEPENDENT_AMBULATORY_CARE_PROVIDER_SITE_OTHER): Admitting: Orthopaedic Surgery

## 2022-08-15 ENCOUNTER — Encounter: Payer: Self-pay | Admitting: Orthopaedic Surgery

## 2022-08-15 DIAGNOSIS — M65311 Trigger thumb, right thumb: Secondary | ICD-10-CM

## 2022-08-15 NOTE — Progress Notes (Signed)
Office Visit Note   Patient: Jeffery Collins           Date of Birth: 10-Oct-1971           MRN: [id number] Visit Date: 08/15/2022              Requested by: No referring provider defined for this encounter. PCP: Pcp, No   Assessment & Plan: Visit Diagnoses:  1. Trigger thumb, right thumb     Plan: Jeffery Collins is now 1 week status post right thumb trigger release.  He is doing well.  Pain is well controlled denies any fever or chills.  He has a well-healing incision without any redness or drainage.  He does not have any triggering and his sensation is intact.  We will provided with waterproof Band-Aid we will follow-up in 1 week for suture removal  Follow-Up Instructions: Return in about 1 week (around 08/22/2022).   Orders:  No orders of the defined types were placed in this encounter.  No orders of the defined types were placed in this encounter.     Procedures: No procedures performed   Clinical Data: No additional findings.   Subjective: Chief Complaint  Patient presents with   Right Hand - Follow-up    Trigger thumb release 08/08/2022  Patient presents today for follow up on his right hand. He is now a week out from right trigger thumb release. His surgery was on 08/08/2022. He is doing well.     Review of Systems  All other systems reviewed and are negative.    Objective: Vital Signs: There were no vitals taken for this visit.  Physical Exam Constitutional:      Appearance: Normal appearance.  Skin:    General: Skin is warm and dry.  Neurological:     General: No focal deficit present.     Mental Status: He is alert.     Ortho Exam Examination of his right hand.  Incision is healing well with well opposed wound edges sutures are in place there is no drainage or redness.  He has brisk capillary refill in his thumb and other digits.  Radial pulses intact.  Sensation is intact.  With movement of his thumb he has no triggering. Specialty  Comments:  No specialty comments available.  Imaging: No results found.   PMFS History: Patient Active Problem List   Diagnosis Date Noted   Trigger thumb, right thumb 04/23/2022   Low back pain 08/25/2017   Sciatica 08/25/2017   IBS (irritable bowel syndrome) 08/25/2017   Elevated blood pressure reading 11/26/2016   Impingement syndrome of right shoulder 10/18/2015   Gastro-esophageal reflux disease without esophagitis 08/16/2015   Plantar fasciitis 08/16/2015   Psoriasis of scalp 08/16/2015   Calculus of kidney 02/07/2015   History reviewed. No pertinent past medical history.  Family History  Problem Relation Age of Onset   Diabetes Mother    Lung cancer Father     Past Surgical History:  Procedure Laterality Date   COLONOSCOPY  2005   LITHOTRIPSY  02/2014   Social History   Occupational History   Not on file  Tobacco Use   Smoking status: Former    Types: Cigarettes   Smokeless tobacco: Never   Tobacco comments:    stopped 2000  Vaping Use   Vaping Use: Never used  Substance and Sexual Activity   Alcohol use: Yes    Alcohol/week: 2.0 standard drinks of alcohol    Types: 2 Standard drinks  or equivalent per week   Drug use: No   Sexual activity: Yes

## 2022-08-21 ENCOUNTER — Telehealth: Admitting: *Deleted

## 2022-08-21 ENCOUNTER — Other Ambulatory Visit: Payer: Self-pay | Admitting: *Deleted

## 2022-08-21 DIAGNOSIS — Z1211 Encounter for screening for malignant neoplasm of colon: Secondary | ICD-10-CM

## 2022-08-21 MED ORDER — CLENPIQ 10-3.5-12 MG-GM -GM/160ML PO SOLN: ORAL | 0 refills | Status: DC

## 2022-08-21 NOTE — Telephone Encounter (Signed)
Gastroenterology Pre-Procedure Review  Request Date: 09/19/2022 Requesting Physician: Dr. Marius Ditch  PATIENT REVIEW QUESTIONS: The patient responded to the following health history questions as indicated:    1. Are you having any GI issues? no 2. Do you have a personal history of Polyps? no 3. Do you have a family history of Colon Cancer or Polyps? yes (father had polyps and grandfather had colon cancer) 4. Diabetes Mellitus? no 5. Joint replacements in the past 12 months?no 6. Major health problems in the past 3 months?no 7. Any artificial heart valves, MVP, or defibrillator?no    MEDICATIONS & ALLERGIES:    Patient reports the following regarding taking any anticoagulation/antiplatelet therapy:   Plavix, Coumadin, Eliquis, Xarelto, Lovenox, Pradaxa, Brilinta, or Effient? no Aspirin? no  Patient confirms/reports the following medications:  Current Outpatient Medications  Medication Sig Dispense Refill   amitriptyline (ELAVIL) 25 MG tablet Take 2 tablets (50 mg total) by mouth at bedtime. 60 tablet 0   etodolac (LODINE) 500 MG tablet Take by mouth.     HYDROcodone-acetaminophen (NORCO/VICODIN) 5-325 MG tablet Take 1 tablet by mouth every 6 (six) hours as needed for moderate pain. 30 tablet 0   naproxen (NAPROSYN) 500 MG tablet Take by mouth.     triamcinolone (KENALOG) 0.025 % ointment APPLY EXTERNALLY TO THE AFFECTED AREA TWICE DAILY 30 g 0   No current facility-administered medications for this visit.    Patient confirms/reports the following allergies:  No Known Allergies  No orders of the defined types were placed in this encounter.   AUTHORIZATION INFORMATION Primary Insurance: 1D#: Group #:  Secondary Insurance: 1D#: Group #:  SCHEDULE INFORMATION: Date: 09/19/2022  Time: Location:[facility]

## 2022-08-22 ENCOUNTER — Ambulatory Visit (INDEPENDENT_AMBULATORY_CARE_PROVIDER_SITE_OTHER): Admitting: Orthopaedic Surgery

## 2022-08-22 ENCOUNTER — Encounter: Payer: Self-pay | Admitting: Orthopaedic Surgery

## 2022-08-22 DIAGNOSIS — M65311 Trigger thumb, right thumb: Secondary | ICD-10-CM

## 2022-08-22 NOTE — Progress Notes (Signed)
   Office Visit Note   Patient: Jeffery Collins           Date of Birth: Mar 03, 1971           MRN: [id number] Visit Date: 08/22/2022              Requested by: No referring provider defined for this encounter. PCP: Pcp, No   Assessment & Plan: Visit Diagnoses:  1. Trigger thumb, right thumb     Plan: 2 weeks status post right trigger thumb release.  Stitches removed.  No active triggering and neurologically intact.  We will plan to see him back as needed  Follow-Up Instructions: Return if symptoms worsen or fail to improve.   Orders:  No orders of the defined types were placed in this encounter.  No orders of the defined types were placed in this encounter.     Procedures: No procedures performed   Clinical Data: No additional findings.   Subjective: Chief Complaint  Patient presents with   Right Hand - Follow-up    Right trigger thumb release 08/08/2022  Patient presents today for follow up on his right hand. He had right trigger thumb release on 08/08/2022. He is now 2 weeks out from surgery. He said that he has noticed some increased swelling, but also reports that he has been using that hand more recently.  No triggering.  HPI  Review of Systems   Objective: Vital Signs: There were no vitals taken for this visit.  Physical Exam  Ortho Exam awake alert and oriented x3.  No acute distress.  Incision is healed nicely at the base of the right thumb volarly from the trigger finger release and the stitches were removed.  Neurologically intact.  No pain and no triggering  Specialty Comments:  No specialty comments available.  Imaging: No results found.   PMFS History: Patient Active Problem List   Diagnosis Date Noted   Trigger thumb, right thumb 04/23/2022   Low back pain 08/25/2017   Sciatica 08/25/2017   IBS (irritable bowel syndrome) 08/25/2017   Elevated blood pressure reading 11/26/2016   Impingement syndrome of right shoulder 10/18/2015    Gastro-esophageal reflux disease without esophagitis 08/16/2015   Plantar fasciitis 08/16/2015   Psoriasis of scalp 08/16/2015   Calculus of kidney 02/07/2015   History reviewed. No pertinent past medical history.  Family History  Problem Relation Age of Onset   Diabetes Mother    Lung cancer Father     Past Surgical History:  Procedure Laterality Date   COLONOSCOPY  2005   LITHOTRIPSY  02/2014   Social History   Occupational History   Not on file  Tobacco Use   Smoking status: Former    Types: Cigarettes   Smokeless tobacco: Never   Tobacco comments:    stopped 2000  Vaping Use   Vaping Use: Never used  Substance and Sexual Activity   Alcohol use: Yes    Alcohol/week: 2.0 standard drinks of alcohol    Types: 2 Standard drinks or equivalent per week   Drug use: No   Sexual activity: Yes

## 2022-08-26 ENCOUNTER — Ambulatory Visit: Admitting: Urology

## 2022-08-26 ENCOUNTER — Encounter: Payer: Self-pay | Admitting: Urology

## 2022-08-26 VITALS — BP 151/92 | HR 89 | Ht 73.0 in | Wt 237.0 lb

## 2022-08-26 DIAGNOSIS — Z87442 Personal history of urinary calculi: Secondary | ICD-10-CM | POA: Diagnosis not present

## 2022-08-26 DIAGNOSIS — R351 Nocturia: Secondary | ICD-10-CM | POA: Diagnosis not present

## 2022-08-26 DIAGNOSIS — E291 Testicular hypofunction: Secondary | ICD-10-CM

## 2022-08-26 DIAGNOSIS — R350 Frequency of micturition: Secondary | ICD-10-CM

## 2022-08-26 DIAGNOSIS — N200 Calculus of kidney: Secondary | ICD-10-CM

## 2022-08-26 NOTE — Progress Notes (Signed)
08/26/2022 2:19 PM   Jeffery Collins 10-10-1971 [id number]  Referring provider: Perrin Maltese, MD [place],  [place] [address]  Chief Complaint  Patient presents with   Urinary Frequency    HPI: Jeffery Collins is a 51 y.o. male referred for evaluation of hypogonadism.  Recent PCP visit with complaints of tiredness, low energy and mild depression Slight decreased libido No erectile dysfunction Has urinary frequency and nocturia x2-3 but states this is not bothersome Testosterone level 07/18/2022 was 100 ng/dL PSA 0.5 Prior history of stone disease without recent symptoms Was on thiazide diuretic for hypercalciuria in 2016 No history of sleep apnea but is scheduled for a sleep study   PMH: Recurrent nephrolithiasis  Surgical History: Past Surgical History:  Procedure Laterality Date   COLONOSCOPY  2005   LITHOTRIPSY  02/2014    Home Medications:  Allergies as of 08/26/2022   No Known Allergies      Medication List        Accurate as of August 26, 2022  2:19 PM. If you have any questions, ask your nurse or doctor.          amitriptyline 25 MG tablet Commonly known as: ELAVIL Take 2 tablets (50 mg total) by mouth at bedtime.   Clenpiq 10-3.5-12 MG-GM -GM/160ML Soln Generic drug: Sod Picosulfate-Mag Ox-Cit Acd At 5:00 pm evening before procedure: Drink ONE (1) 6-ounce bottle of CLENPIQ straight from the bottle. Keep Hydrating: Drink 5 cups (8 oz x 5) of clear liquid. Be sure to drink the entire contents of the bottle. 2.   On the day of procedure, 5 Hours before procedure: Drink the SECOND 6-ounce bottle of CLENPIQ straight from the bottle. Drink at least 4 cups (8 oz x 4) of clear liquids   etodolac 500 MG tablet Commonly known as: LODINE Take by mouth.   HYDROcodone-acetaminophen 5-325 MG tablet Commonly known as: NORCO/VICODIN Take 1 tablet by mouth every 6 (six) hours as needed for moderate pain.   naproxen 500 MG  tablet Commonly known as: NAPROSYN Take by mouth.   triamcinolone 0.025 % ointment Commonly known as: KENALOG APPLY EXTERNALLY TO THE AFFECTED AREA TWICE DAILY        Allergies: No Known Allergies  Family History: Family History  Problem Relation Age of Onset   Diabetes Mother    Lung cancer Father     Social History:  reports that he has quit smoking. His smoking use included cigarettes. He has never used smokeless tobacco. He reports current alcohol use of about 2.0 standard drinks of alcohol per week. He reports that he does not use drugs.   Physical Exam: BP (!) 151/92   Pulse 89   Ht 6\' 1"  (1.854 m)   Wt 237 lb (107.5 kg)   BMI 31.27 kg/m   Constitutional:  Alert and oriented, No acute distress. HEENT: [place] AT Respiratory: Normal respiratory effort, no increased work of breathing. GU: Penis without lesions; testes descended bilaterally without masses or tenderness.  Estimated volume ~ 20 cc bilaterally Skin: No rashes, bruises or suspicious lesions. Neurologic: Grossly intact, no focal deficits, moving all 4 extremities. Psychiatric: Normal mood and affect.  Laboratory Data:  Urinalysis Dipstick/microscopy negative   Assessment & Plan:    1.  Hypogonadism Symptoms significant tiredness, fatigue; mild decreased libido; depressed mood We discussed the diagnosis of testosterone is based on 2 abnormal total testosterone levels drawn in the a.m. admitted with signs and symptoms of low testosterone.  He will be scheduled for an a.m. testosterone level and LH We discussed various forms of testosterone placement including topical preparations, intramuscular injections, subcutaneous injections, subcutaneous pellet implantation and oral testosterone.  Pros and cons of each form were discussed.  The risk of transference of topical testosterone. I had an extensive discussion regarding testosterone replacement therapy including the following: Treatment may result in  improvements in erectile function, low sex drive, anemia, bone mineral density, lean body mass, and depressive symptoms; evidence is inconclusive whether testosterone therapy improves cognitive function, measures of diabetes, energy, fatigue, lipid profiles, and quality of life measures; there is no conclusive evidence linking testosterone therapy to the development of prostate cancer; there is no definitive evidence linking testosterone therapy to a higher incidence of venothrombolic events; at the present time it cannot be stated definitively whether testosterone therapy increases or decreases the risk of cardiovascular events including myocardial infarction and stroke. Potential side effects were discussed including erythrocytosis, gynecomastia.  The need for regular monitoring of testosterone levels and hematocrit was discussed. He is interested in [place] if covered by his insurance company  2.  History recurrent nephrolithiasis No recent symptoms UA clear Declined KUB  3.  Urinary frequency/nocturia Not bothersome   Riki Altes, MD  [facility] [address], [address] [place], [place] [address] [phone]

## 2022-08-27 LAB — URINALYSIS, COMPLETE
Bilirubin, UA: NEGATIVE
Glucose, UA: NEGATIVE
Ketones, UA: NEGATIVE
Leukocytes,UA: NEGATIVE
Nitrite, UA: NEGATIVE
Protein,UA: NEGATIVE
RBC, UA: NEGATIVE
Specific Gravity, UA: 1.025 (ref 1.005–1.030)
Urobilinogen, Ur: 1 mg/dL (ref 0.2–1.0)
pH, UA: 5.5 (ref 5.0–7.5)

## 2022-08-27 LAB — MICROSCOPIC EXAMINATION

## 2022-09-04 ENCOUNTER — Other Ambulatory Visit

## 2022-09-04 DIAGNOSIS — E291 Testicular hypofunction: Secondary | ICD-10-CM

## 2022-09-05 ENCOUNTER — Ambulatory Visit: Admitting: Surgery

## 2022-09-05 LAB — TESTOSTERONE: Testosterone: 198 ng/dL — ABNORMAL LOW (ref 264–916)

## 2022-09-05 LAB — LUTEINIZING HORMONE: LH: 7.4 m[IU]/mL (ref 1.7–8.6)

## 2022-09-08 ENCOUNTER — Encounter: Payer: Self-pay | Admitting: Urology

## 2022-09-08 ENCOUNTER — Other Ambulatory Visit: Payer: Self-pay | Admitting: Urology

## 2022-09-08 MED ORDER — XYOSTED 75 MG/0.5ML ~~LOC~~ SOAJ: 75.0000 mg | SUBCUTANEOUS | 3 refills | Status: DC

## 2022-09-09 NOTE — Progress Notes (Signed)
Patient ID: Jeffery Collins, male   DOB: 10/02/1971, 51 y.o.   MRN: [id number]  Chief Complaint: Concern regarding bilateral axillary lymph nodes.  History of Present Illness [name] is a 51 y.o. male with a noted lipomatous/fallen axillary soft tissue, whose observant wife completed pinch styled examination of the axillary soft tissue, and raise concerns about lymphadenopathy.  Subsequent bilateral axillary ultrasound is completed.  Patient has no recent history of malaise, fatigue, night sweats other lymphadenopathy, upper extremity or truncal skin lesions or infections.  No known recent viral illness, fevers or chills etc. No history of trial antibiotics.  Past Medical History No past medical history on file.    Past Surgical History:  Procedure Laterality Date   COLONOSCOPY  2005   LITHOTRIPSY  02/2014    No Known Allergies  Current Outpatient Medications  Medication Sig Dispense Refill   amitriptyline (ELAVIL) 25 MG tablet Take 2 tablets (50 mg total) by mouth at bedtime. 60 tablet 0   etodolac (LODINE) 500 MG tablet Take by mouth.     HYDROcodone-acetaminophen (NORCO/VICODIN) 5-325 MG tablet Take 1 tablet by mouth every 6 (six) hours as needed for moderate pain. 30 tablet 0   naproxen (NAPROSYN) 500 MG tablet Take by mouth.     Sod Picosulfate-Mag Ox-Cit Acd (CLENPIQ) 10-3.5-12 MG-GM -GM/160ML SOLN At 5:00 pm evening before procedure: Drink ONE (1) 6-ounce bottle of CLENPIQ straight from the bottle. Keep Hydrating: Drink 5 cups (8 oz x 5) of clear liquid. Be sure to drink the entire contents of the bottle. 2.   On the day of procedure, 5 Hours before procedure: Drink the SECOND 6-ounce bottle of CLENPIQ straight from the bottle. Drink at least 4 cups (8 oz x 4) of clear liquids 320 mL 0   Testosterone Enanthate (XYOSTED) 75 MG/0.5ML SOAJ Inject 75 mg into the skin once a week. 2 mL 3   triamcinolone (KENALOG) 0.025 % ointment APPLY EXTERNALLY TO THE AFFECTED AREA  TWICE DAILY 30 g 0   No current facility-administered medications for this visit.    Family History Family History  Problem Relation Age of Onset   Diabetes Mother    Lung cancer Father       Social History Social History   Tobacco Use   Smoking status: Former    Types: Cigarettes   Smokeless tobacco: Never   Tobacco comments:    stopped 2000  Vaping Use   Vaping Use: Never used  Substance Use Topics   Alcohol use: Yes    Alcohol/week: 2.0 standard drinks of alcohol    Types: 2 Standard drinks or equivalent per week   Drug use: No        Review of Systems  Constitutional:  Negative for chills, diaphoresis, fever, malaise/fatigue and weight loss.  HENT:  Negative for hearing loss, nosebleeds, sore throat and tinnitus.   Eyes:  Negative for blurred vision, double vision and pain.  Respiratory:  Negative for cough, hemoptysis, sputum production, shortness of breath, wheezing and stridor.   Cardiovascular:  Negative for chest pain, palpitations, orthopnea, claudication and leg swelling.  Gastrointestinal:  Negative for abdominal pain, blood in stool, constipation, diarrhea, heartburn, melena, nausea and vomiting.  Skin:  Negative for itching and rash.  Neurological:  Negative for dizziness, tingling, sensory change, speech change, focal weakness, seizures, loss of consciousness and headaches.  Psychiatric/Behavioral:  Negative for depression, substance abuse and suicidal ideas.       Physical Exam There were no vitals  taken for this visit.   CONSTITUTIONAL: Well developed, and nourished, appropriately responsive and aware without distress.   EYES: Sclera non-icteric.   EARS, NOSE, MOUTH AND THROAT:  The oropharynx is clear. Oral mucosa is pink and moist.    Hearing is intact to voice.  NECK: Trachea is midline, and there is no jugular venous distension.  LYMPH NODES:  Lymph nodes in the neck and supraclavicular areas are not enlarged. No palpable axillary  lymphadenopathy. RESPIRATORY:  Lungs are clear, and breath sounds are equal bilaterally. Normal respiratory effort without pathologic use of accessory muscles. CARDIOVASCULAR: Heart is regular in rate and rhythm. GI: The abdomen is  soft, nontender, and nondistended. There were no palpable masses. I did not appreciate hepatosplenomegaly.  GU: No inguinal palpable lymphadenopathy. MUSCULOSKELETAL:  Symmetrical muscle tone appreciated in all four extremities.    SKIN: Skin turgor is normal. No pathologic skin lesions appreciated.  NEUROLOGIC:  Motor and sensation appear grossly normal.  Cranial nerves are grossly without defect. PSYCH:  Alert and oriented to person, place and time. Affect is appropriate for situation.  Data Reviewed I have personally reviewed what is currently available of the patient's imaging, recent labs and medical records.   Labs:     Latest Ref Rng & Units 06/23/2017   11:37 AM 02/27/2014    3:12 AM  CBC  WBC 3.4 - 10.8 x10E3/uL 6.2  10.8   Hemoglobin 13.0 - 17.7 g/dL 75.6  43.3   Hematocrit 37.5 - 51.0 % 41.8  40.7   Platelets 150 - 379 x10E3/uL 209  171       Latest Ref Rng & Units 06/23/2017   11:37 AM 02/27/2014    3:12 AM  CMP  Glucose 65 - 99 mg/dL 80  295   BUN 6 - 24 mg/dL 11  20   Creatinine 1.88 - 1.27 mg/dL 4.16  6.06   Sodium 301 - 144 mmol/L 143  139   Potassium 3.5 - 5.2 mmol/L 4.1  3.9   Chloride 96 - 106 mmol/L 100  104   CO2 20 - 29 mmol/L 25  29   Calcium 8.7 - 10.2 mg/dL 60.1  9.1   Total Protein 6.0 - 8.5 g/dL 7.5  7.5   Total Bilirubin 0.0 - 1.2 mg/dL 0.4  0.4   Alkaline Phos 39 - 117 IU/L 108  109   AST 0 - 40 IU/L 21  20   ALT 0 - 44 IU/L 23  34       Imaging: Radiology review:  Impression  Bilateral slightly enlarged axillary lymph nodes with normal morphology.  These may represent reactive lymph nodes. Clinical correlation is  recommended.  Otherwise, no obvious evidence of focal lesions or enlarged nodes with  abnormal  morphology. Narrative  EXAM: [place] AXILLARY BILATERAL  DATE: 08/16/2022 2:06 PM  ACCESSION: [id number]  DICTATED: 08/16/2022 1:54 PM  INTERPRETATION LOCATION: [facility]   CLINICAL INDICATION: 51 years old Male with mass on both sides  - R22.9 -  Localized superficial swelling, mass, or lump     COMPARISON: None   TECHNIQUE: Ultrasound static and cine images of the bilateral axilla.   FINDINGS:  Multiple reniform lymph nodes with thin hypoechoic cortex, prominent hilar  fat. The largest on the right and left measure 1.2 and 1.1 cm in short  axis respectively. Exam End: 08/16/22 14:06   Specimen Collected: 08/16/22 13:54 Last Resulted: 08/16/22 18:33  Received From: [facility]    Within  last 24 hrs: No results found.  Assessment    Bilateral slightly enlarged axillary lymph nodes unable to appreciate clinical exam. These likely represent reactive lymph nodes.   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  I believe it would be prudent to continue to observe for 3 months, considering there are no associated other adenopathy, or potential causation of their reactivity. I would repeat bilateral axillary ultrasound at that time.  We will glad least see him sooner should anything change. Options of further imaging with chest/abdominal CT, chest x-ray considered. Face-to-face time spent with the patient and accompanying care providers(if present) was 30  minutes, with more than 50% of the time spent counseling, educating, and coordinating care of the patient.    These notes generated with voice recognition software. I apologize for typographical errors.  Ronny Bacon M.D., FACS 09/09/2022, 10:07 PM

## 2022-09-10 ENCOUNTER — Encounter: Payer: Self-pay | Admitting: Surgery

## 2022-09-10 ENCOUNTER — Ambulatory Visit: Admitting: Surgery

## 2022-09-10 VITALS — BP 140/96 | HR 92 | Temp 98.3°F | Ht 73.0 in | Wt 241.6 lb

## 2022-09-10 DIAGNOSIS — R599 Enlarged lymph nodes, unspecified: Secondary | ICD-10-CM

## 2022-09-10 DIAGNOSIS — R590 Localized enlarged lymph nodes: Secondary | ICD-10-CM

## 2022-09-10 NOTE — Patient Instructions (Addendum)
If you have any concerns or questions, please feel free to call our office. Follow up in 3 months.  Lymphadenopathy  Lymphadenopathy means that your lymph glands are swollen or larger than normal. Lymph glands, also called lymph nodes, are collections of tissue that filter excess fluid, bacteria, viruses, and waste from your bloodstream. They are part of your body's disease-fighting system (immune system), which protects your body from germs. There may be different causes of lymphadenopathy, depending on where it is in your body. Some types go away on their own. Lymphadenopathy can occur anywhere that you have lymph glands, including these areas: Neck (cervical lymphadenopathy). Chest (mediastinal lymphadenopathy). Lungs (hilar lymphadenopathy). Underarms (axillary lymphadenopathy). Groin (inguinal lymphadenopathy). When your immune system responds to germs, infection-fighting cells and fluid build up in your lymph glands. This causes some swelling and enlargement. If the lymph nodes do not go back to normal size after you have an infection or disease, your health care provider may do tests. These tests help to monitor your condition and find the reason why the glands are still swollen and enlarged. Follow these instructions at home:  Get plenty of rest. Your health care provider may recommend over-the-counter medicines for pain. Take over-the-counter and prescription medicines only as told by your health care provider. If directed, apply heat to swollen lymph glands as often as told by your health care provider. Use the heat source that your health care provider recommends, such as a moist heat pack or a heating pad. Place a towel between your skin and the heat source. Leave the heat on for 20-30 minutes. Remove the heat if your skin turns bright red. This is especially important if you are unable to feel pain, heat, or cold. You may have a greater risk of getting burned. Check your affected  lymph glands every day for changes. Check other lymph gland areas as told by your health care provider. Check for changes such as: More swelling. Sudden increase in size. Redness or pain. Hardness. Keep all follow-up visits. This is important. Contact a health care provider if you have: Lymph glands that: Are still swollen after 2 weeks. Have suddenly gotten bigger or the swelling spreads. Are red, painful, or hard. Fluid leaking from the skin near an enlarged lymph gland. Problems with breathing. A fever, chills, or night sweats. Fatigue. A sore throat. Pain in your abdomen. Weight loss. Get help right away if you have: Severe pain. Chest pain. Shortness of breath. These symptoms may represent a serious problem that is an emergency. Do not wait to see if the symptoms will go away. Get medical help right away. Call your local emergency services (911 in the [place] Do not drive yourself to the hospital. Summary Lymphadenopathy means that your lymph glands are swollen or larger than normal. Lymph glands, also called lymph nodes, are collections of tissue that filter excess fluid, bacteria, viruses, and waste from the bloodstream. They are part of your body's disease-fighting system (immune system). Lymphadenopathy can occur anywhere that you have lymph glands. If the lymph nodes do not go back to normal size after you have an infection or disease, your health care provider may do tests to monitor your condition and find the reason why the glands are still swollen and enlarged. Check your affected lymph glands every day for changes. Check other lymph gland areas as told by your health care provider. This information is not intended to replace advice given to you by your health care provider. Make sure you  discuss any questions you have with your health care provider. Document Revised: 08/30/2020 Document Reviewed: 08/30/2020 Elsevier Patient Education  [place].

## 2022-09-12 ENCOUNTER — Other Ambulatory Visit: Payer: Self-pay | Admitting: Urology

## 2022-09-12 MED ORDER — XYOSTED 75 MG/0.5ML ~~LOC~~ SOAJ: 75.0000 mg | SUBCUTANEOUS | 3 refills | Status: DC

## 2022-09-17 ENCOUNTER — Telehealth: Payer: Self-pay | Admitting: Family Medicine

## 2022-09-17 DIAGNOSIS — E291 Testicular hypofunction: Secondary | ICD-10-CM

## 2022-09-17 MED ORDER — TESTOSTERONE 10 MG/ACT (2%) TD GEL: TRANSDERMAL | 0 refills | Status: DC

## 2022-09-17 NOTE — Telephone Encounter (Signed)
Patient notified the PA for Jeffery Collins has been denied. The request per insurance is that he try and fail testosterone cypionate and Androgel for a 3 months trial of each before approval can be considered. Patient is wanting to try Androgel.

## 2022-09-17 NOTE — Telephone Encounter (Signed)
It looks like generic Jeffery Collins is the approved gel by his insurance.  This is applied to the front or inner thigh region (4 pumps total) daily.  Please schedule lab visit for a testosterone level 6 weeks after he starts the medication

## 2022-09-18 NOTE — Addendum Note (Signed)
Addended by: Kyra Manges on: 09/18/2022 11:40 AM   Modules accepted: Orders

## 2022-09-18 NOTE — Telephone Encounter (Signed)
Patient notified and will call back when he starts the medication. He will need to be scheduled for Testosterone blood work in 6 weeks from the day he starts the gel.

## 2022-09-19 ENCOUNTER — Ambulatory Visit
Admission: RE | Admit: 2022-09-19 | Discharge: 2022-09-19 | Disposition: A | Discharge status: Home / Self Care | Attending: Gastroenterology | Admitting: Gastroenterology

## 2022-09-19 ENCOUNTER — Encounter
Admission: RE | Disposition: A | Discharge status: Home / Self Care | Payer: Self-pay | Source: Home / Self Care | Attending: Gastroenterology

## 2022-09-19 ENCOUNTER — Encounter: Payer: Self-pay | Admitting: Gastroenterology

## 2022-09-19 ENCOUNTER — Ambulatory Visit: Admitting: Anesthesiology

## 2022-09-19 DIAGNOSIS — Z87891 Personal history of nicotine dependence: Secondary | ICD-10-CM | POA: Diagnosis not present

## 2022-09-19 DIAGNOSIS — Z86010 Personal history of colonic polyps: Secondary | ICD-10-CM

## 2022-09-19 DIAGNOSIS — K219 Gastro-esophageal reflux disease without esophagitis: Secondary | ICD-10-CM | POA: Diagnosis not present

## 2022-09-19 DIAGNOSIS — Z1211 Encounter for screening for malignant neoplasm of colon: Secondary | ICD-10-CM | POA: Diagnosis present

## 2022-09-19 DIAGNOSIS — K635 Polyp of colon: Secondary | ICD-10-CM

## 2022-09-19 HISTORY — PX: COLONOSCOPY WITH PROPOFOL: SHX5780

## 2022-09-19 SURGERY — COLONOSCOPY WITH PROPOFOL
Anesthesia: General

## 2022-09-19 MED ORDER — LIDOCAINE HCL (CARDIAC) PF 100 MG/5ML IV SOSY
PREFILLED_SYRINGE | INTRAVENOUS | Status: DC | PRN
  Administered 2022-09-19: 100 mg via INTRAVENOUS

## 2022-09-19 MED ORDER — PROPOFOL 500 MG/50ML IV EMUL
INTRAVENOUS | Status: DC | PRN
  Administered 2022-09-19: 192.132 ug/kg/min via INTRAVENOUS

## 2022-09-19 MED ORDER — DEXMEDETOMIDINE HCL 200 MCG/2ML IV SOLN
INTRAVENOUS | Status: DC | PRN
  Administered 2022-09-19: 12 ug via INTRAVENOUS

## 2022-09-19 MED ORDER — SODIUM CHLORIDE 0.9 % IV SOLN: INTRAVENOUS | Status: DC

## 2022-09-19 MED ORDER — PROPOFOL 10 MG/ML IV BOLUS
INTRAVENOUS | Status: DC | PRN
  Administered 2022-09-19: 120 mg via INTRAVENOUS

## 2022-09-19 NOTE — Op Note (Signed)
[facility] Patient Name: Tabitha Tupper Procedure Date: 09/19/2022 10:08 AM MRN: [id number] Account #: [id number] Date of Birth: [birth year] Admit Type: Outpatient Age: 51 Room: [facility] ENDO ROOM 4 Gender: Male Note Status: Finalized Instrument Name: Prentice Docker [id number] Procedure:             Colonoscopy Indications:           Screening for colorectal malignant neoplasm Providers:             Toney Reil MD, MD Referring MD:          No Local Md, MD (Referring MD) Medicines:             General Anesthesia Complications:         No immediate complications. Estimated blood loss: None. Procedure:             Pre-Anesthesia Assessment:                        - Prior to the procedure, a History and Physical was                         performed, and patient medications and allergies were                         reviewed. The patient is competent. The risks and                         benefits of the procedure and the sedation options and                         risks were discussed with the patient. All questions                         were answered and informed consent was obtained.                         Patient identification and proposed procedure were                         verified by the physician, the nurse, the                         anesthesiologist, the anesthetist and the technician                         in the pre-procedure area in the procedure room in the                         endoscopy suite. Mental Status Examination: alert and                         oriented. Airway Examination: normal oropharyngeal                         airway and neck mobility. Respiratory Examination:                         clear to auscultation. CV Examination: normal.  Prophylactic Antibiotics: The patient does not require                         prophylactic antibiotics. Prior Anticoagulants: The                          patient has taken no anticoagulant or antiplatelet                         agents. ASA Grade Assessment: II - A patient with mild                         systemic disease. After reviewing the risks and                         benefits, the patient was deemed in satisfactory                         condition to undergo the procedure. The anesthesia                         plan was to use general anesthesia. Immediately prior                         to administration of medications, the patient was                         re-assessed for adequacy to receive sedatives. The                         heart rate, respiratory rate, oxygen saturations,                         blood pressure, adequacy of pulmonary ventilation, and                         response to care were monitored throughout the                         procedure. The physical status of the patient was                         re-assessed after the procedure.                        After obtaining informed consent, the colonoscope was                         passed under direct vision. Throughout the procedure,                         the patient's blood pressure, pulse, and oxygen                         saturations were monitored continuously. The                         Colonoscope was introduced through the anus and  advanced to the the cecum, identified by appendiceal                         orifice and ileocecal valve. The colonoscopy was                         performed without difficulty. The patient tolerated                         the procedure well. The quality of the bowel                         preparation was evaluated using the BBPS [facility] Bowel                         Preparation Scale) with scores of: Right Colon = 3,                         Transverse Colon = 3 and Left Colon = 3 (entire mucosa                         seen well with no residual staining, small fragments                          of stool or opaque liquid). The total BBPS score                         equals 9. The ileocecal valve, appendiceal orifice,                         and rectum were photographed. Findings:      The perianal and digital rectal examinations were normal. Pertinent       negatives include normal sphincter tone and no palpable rectal lesions.      The entire examined colon appeared normal.      The retroflexed view of the distal rectum and anal verge was normal and       showed no anal or rectal abnormalities. Impression:            - The entire examined colon is normal.                        - The distal rectum and anal verge are normal on                         retroflexion view.                        - No specimens collected. Recommendation:        - Discharge patient to home (with escort).                        - Resume previous diet today.                        - Continue present medications.                        - Repeat colonoscopy in  10 years for screening                         purposes. Procedure Code(s):     --- Professional ---                        Y5035, Colorectal cancer screening; colonoscopy on                         individual not meeting criteria for high risk Diagnosis Code(s):     --- Professional ---                        Z12.11, Encounter for screening for malignant neoplasm                         of colon CPT copyright 2022 American Medical Association. All rights reserved. The codes documented in this report are preliminary and upon coder review may  be revised to meet current compliance requirements. Dr. Ulyess Mort Lin Landsman MD, MD 09/19/2022 10:33:39 AM This report has been signed electronically. Number of Addenda: 0 Note Initiated On: 09/19/2022 10:08 AM Scope Withdrawal Time: 0 hours 15 minutes 9 seconds  Total Procedure Duration: 0 hours 16 minutes 41 seconds  Estimated Blood Loss:  Estimated blood loss: none.      [facility]

## 2022-09-19 NOTE — Anesthesia Postprocedure Evaluation (Signed)
Anesthesia Post Note  Patient: Jeffery Collins  Procedure(s) Performed: COLONOSCOPY WITH PROPOFOL  Patient location during evaluation: Endoscopy Anesthesia Type: General Level of consciousness: awake and alert Pain management: pain level controlled Vital Signs Assessment: post-procedure vital signs reviewed and stable Respiratory status: spontaneous breathing, nonlabored ventilation and respiratory function stable Cardiovascular status: blood pressure returned to baseline and stable Postop Assessment: no apparent nausea or vomiting Anesthetic complications: no   No notable events documented.   Last Vitals:  Vitals:   09/19/22 1044 09/19/22 1054  BP: 115/81 125/84  Pulse: 85 82  Resp: 19 17  Temp:    SpO2: 94% 98%    Last Pain:  Vitals:   09/19/22 1054  TempSrc:   PainSc: 0-No pain                 Iran Ouch

## 2022-09-19 NOTE — H&P (Signed)
Arlyss Repress, MD [address]  [address]  [place], [place] [address]  Main: [phone]  Fax: [phone] Pager: [phone]  Primary Care Physician:  Pcp, No Primary Gastroenterologist:  Dr. Arlyss Repress  Pre-Procedure History & Physical: HPI:  Jeffery Collins is a 51 y.o. male is here for an colonoscopy.   History reviewed. No pertinent past medical history.  Past Surgical History:  Procedure Laterality Date   COLONOSCOPY  2005   LITHOTRIPSY  02/2014    Prior to Admission medications   Medication Sig Start Date End Date Taking? Authorizing Provider  amitriptyline (ELAVIL) 25 MG tablet Take 2 tablets (50 mg total) by mouth at bedtime. 12/22/17 01/21/18  Toney Reil, MD  etodolac (LODINE) 500 MG tablet Take by mouth. 01/10/17   [provider]  HYDROcodone-acetaminophen (NORCO/VICODIN) 5-325 MG tablet Take 1 tablet by mouth every 6 (six) hours as needed for moderate pain. 08/08/22   Valeria Batman, MD  naproxen (NAPROSYN) 500 MG tablet Take by mouth. 09/04/15   [provider]  Sod Picosulfate-Mag Ox-Cit Acd (CLENPIQ) 10-3.5-12 MG-GM -GM/160ML SOLN At 5:00 pm evening before procedure: Drink ONE (1) 6-ounce bottle of CLENPIQ straight from the bottle. Keep Hydrating: Drink 5 cups (8 oz x 5) of clear liquid. Be sure to drink the entire contents of the bottle. 2.   On the day of procedure, 5 Hours before procedure: Drink the SECOND 6-ounce bottle of CLENPIQ straight from the bottle. Drink at least 4 cups (8 oz x 4) of clear liquids 08/21/22   Toney Reil, MD  Testosterone (FORTESTA) 10 MG/ACT (2%) GEL Apply 4 pumps to inner/front thigh region daily 09/17/22   Stoioff, Verna Czech, MD  triamcinolone (KENALOG) 0.025 % ointment APPLY EXTERNALLY TO THE AFFECTED AREA TWICE DAILY 01/31/20   Reubin Milan, MD    Allergies as of 08/21/2022   (No Known Allergies)    Family History  Problem Relation Age of Onset   Diabetes Mother    Lung  cancer Father     Social History   Socioeconomic History   Marital status: Married    Spouse name: Not on file   Number of children: Not on file   Years of education: Not on file   Highest education level: Not on file  Occupational History   Not on file  Tobacco Use   Smoking status: Former    Types: Cigarettes   Smokeless tobacco: Never   Tobacco comments:    stopped 2000  Vaping Use   Vaping Use: Never used  Substance and Sexual Activity   Alcohol use: Yes    Alcohol/week: 2.0 standard drinks of alcohol    Types: 2 Standard drinks or equivalent per week   Drug use: No   Sexual activity: Yes  Other Topics Concern   Not on file  Social History Narrative   Not on file   Social Determinants of Health   Financial Resource Strain: Not on file  Food Insecurity: Not on file  Transportation Needs: Not on file  Physical Activity: Not on file  Stress: Not on file  Social Connections: Not on file  Intimate Partner Violence: Not on file    Review of Systems: See HPI, otherwise negative ROS  Physical Exam: BP 136/89   Pulse 87   Temp (!) 96.9 F (36.1 C) (Temporal)   Resp 17   Ht 6\' 1"  (1.854 m)   Wt 109.3 kg   SpO2 99%  BMI 31.80 kg/m  General:   Alert,  pleasant and cooperative in NAD Head:  Normocephalic and atraumatic. Neck:  Supple; no masses or thyromegaly. Lungs:  Clear throughout to auscultation.    Heart:  Regular rate and rhythm. Abdomen:  Soft, nontender and nondistended. Normal bowel sounds, without guarding, and without rebound.   Neurologic:  Alert and  oriented x4;  grossly normal neurologically.  Impression/Plan: Jeffery Collins is here for an colonoscopy to be performed for colon cancer screening  Risks, benefits, limitations, and alternatives regarding  colonoscopy have been reviewed with the patient.  Questions have been answered.  All parties agreeable.   Sherri Sear, MD  09/19/2022, 9:34 AM

## 2022-09-19 NOTE — Transfer of Care (Signed)
Immediate Anesthesia Transfer of Care Note  Patient: Jeffery Collins  Procedure(s) Performed: COLONOSCOPY WITH PROPOFOL  Patient Location: Endoscopy Unit  Anesthesia Type:General  Level of Consciousness: drowsy  Airway & Oxygen Therapy: Patient Spontanous Breathing  Post-op Assessment: Report given to RN and Post -op Vital signs reviewed and stable  Post vital signs: Reviewed and stable   Last Vitals:  Vitals Value Taken Time  BP    Temp    Pulse 89 09/19/22 1034  Resp 16 09/19/22 1034  SpO2 94 % 09/19/22 1034  Vitals shown include unvalidated device data.  Last Pain:  Vitals:   09/19/22 0842  TempSrc: Temporal  PainSc: 0-No pain         Complications: No notable events documented.

## 2022-09-19 NOTE — Anesthesia Preprocedure Evaluation (Addendum)
Anesthesia Evaluation  Patient identified by MRN, date of birth, ID band Patient awake    Reviewed: Allergy & Precautions, NPO status , Patient's Chart, lab work & pertinent test results  Airway Mallampati: III  TM Distance: >3 FB Neck ROM: full    Dental no notable dental hx.    Pulmonary former smoker   Pulmonary exam normal        Cardiovascular negative cardio ROS Normal cardiovascular exam     Neuro/Psych negative neurological ROS  negative psych ROS   GI/Hepatic Neg liver ROS,GERD  Controlled,,  Endo/Other  negative endocrine ROS    Renal/GU negative Renal ROS  negative genitourinary   Musculoskeletal   Abdominal Normal abdominal exam  (+)   Peds  Hematology negative hematology ROS (+)   Anesthesia Other Findings History reviewed. No pertinent past medical history.  Past Surgical History: 2005: COLONOSCOPY 02/2014: LITHOTRIPSY     Reproductive/Obstetrics negative OB ROS                             Anesthesia Physical Anesthesia Plan  ASA: 2  Anesthesia Plan: General   Post-op Pain Management:    Induction: Intravenous  PONV Risk Score and Plan: Propofol infusion and TIVA  Airway Management Planned: Natural Airway  Additional Equipment:   Intra-op Plan:   Post-operative Plan:   Informed Consent: I have reviewed the patients History and Physical, chart, labs and discussed the procedure including the risks, benefits and alternatives for the proposed anesthesia with the patient or authorized representative who has indicated his/her understanding and acceptance.     Dental Advisory Given  Plan Discussed with: Anesthesiologist, CRNA and Surgeon  Anesthesia Plan Comments:         Anesthesia Quick Evaluation

## 2022-09-20 ENCOUNTER — Encounter: Payer: Self-pay | Admitting: Gastroenterology

## 2022-10-23 ENCOUNTER — Other Ambulatory Visit: Payer: Self-pay

## 2022-10-23 DIAGNOSIS — R599 Enlarged lymph nodes, unspecified: Secondary | ICD-10-CM

## 2022-10-29 ENCOUNTER — Other Ambulatory Visit

## 2022-10-29 ENCOUNTER — Encounter: Payer: Self-pay | Admitting: Urology

## 2022-10-31 ENCOUNTER — Telehealth: Payer: Self-pay

## 2022-10-31 NOTE — Telephone Encounter (Signed)
Patient is scheduled for an ultrasound on 11/05/22. Need to schedule him a follow up with Dr Claudine Mouton for after this for an exam and to discuss the results.

## 2022-11-05 ENCOUNTER — Ambulatory Visit: Admission: RE | Admit: 2022-11-05 | Source: Ambulatory Visit

## 2022-11-21 ENCOUNTER — Ambulatory Visit: Attending: Surgery

## 2022-11-25 NOTE — Progress Notes (Deleted)
Patient ID: Jeffery Collins, male   DOB: 10-Jun-1971, 52 y.o.   MRN: [id number]  Chief Complaint: Concern regarding bilateral axillary lymph nodes.  History of Present Illness Jeffery Collins is a 52 y.o. male with a noted lipomatous/fallen axillary soft tissue, whose observant wife completed pinch styled examination of the axillary soft tissue, and raise concerns about lymphadenopathy.  Subsequent bilateral axillary ultrasound is completed.  Patient has no recent history of malaise, fatigue, night sweats other lymphadenopathy, upper extremity or truncal skin lesions or infections.  No known recent viral illness, fevers or chills etc. No history of trial antibiotics.  Past Medical History No past medical history on file.    Past Surgical History:  Procedure Laterality Date   COLONOSCOPY  2005   COLONOSCOPY WITH PROPOFOL N/A 09/19/2022   Procedure: COLONOSCOPY WITH PROPOFOL;  Surgeon: Toney Reil, MD;  Location: [facility] ENDOSCOPY;  Service: Gastroenterology;  Laterality: N/A;   LITHOTRIPSY  02/2014    No Known Allergies  Current Outpatient Medications  Medication Sig Dispense Refill   amitriptyline (ELAVIL) 25 MG tablet Take 2 tablets (50 mg total) by mouth at bedtime. 60 tablet 0   etodolac (LODINE) 500 MG tablet Take by mouth.     HYDROcodone-acetaminophen (NORCO/VICODIN) 5-325 MG tablet Take 1 tablet by mouth every 6 (six) hours as needed for moderate pain. 30 tablet 0   naproxen (NAPROSYN) 500 MG tablet Take by mouth.     Testosterone (FORTESTA) 10 MG/ACT (2%) GEL Apply 4 pumps to inner/front thigh region daily 180 g 0   triamcinolone (KENALOG) 0.025 % ointment APPLY EXTERNALLY TO THE AFFECTED AREA TWICE DAILY 30 g 0   No current facility-administered medications for this visit.    Family History Family History  Problem Relation Age of Onset   Diabetes Mother    Lung cancer Father       Social History Social History   Tobacco Use   Smoking status: Former     Types: Cigarettes   Smokeless tobacco: Never   Tobacco comments:    stopped 2000  Vaping Use   Vaping Use: Never used  Substance Use Topics   Alcohol use: Yes    Alcohol/week: 2.0 standard drinks of alcohol    Types: 2 Standard drinks or equivalent per week   Drug use: No        Review of Systems  Constitutional:  Negative for chills, diaphoresis, fever, malaise/fatigue and weight loss.  HENT:  Negative for hearing loss, nosebleeds, sore throat and tinnitus.   Eyes:  Negative for blurred vision, double vision and pain.  Respiratory:  Negative for cough, hemoptysis, sputum production, shortness of breath, wheezing and stridor.   Cardiovascular:  Negative for chest pain, palpitations, orthopnea, claudication and leg swelling.  Gastrointestinal:  Negative for abdominal pain, blood in stool, constipation, diarrhea, heartburn, melena, nausea and vomiting.  Skin:  Negative for itching and rash.  Neurological:  Negative for dizziness, tingling, sensory change, speech change, focal weakness, seizures, loss of consciousness and headaches.  Psychiatric/Behavioral:  Negative for depression, substance abuse and suicidal ideas.       Physical Exam There were no vitals taken for this visit.   CONSTITUTIONAL: Well developed, and nourished, appropriately responsive and aware without distress.   EYES: Sclera non-icteric.   EARS, NOSE, MOUTH AND THROAT:  The oropharynx is clear. Oral mucosa is pink and moist.    Hearing is intact to voice.  NECK: Trachea is midline, and there is no jugular venous distension.  LYMPH NODES:  Lymph nodes in the neck and supraclavicular areas are not enlarged. No palpable axillary lymphadenopathy. RESPIRATORY:  Lungs are clear, and breath sounds are equal bilaterally. Normal respiratory effort without pathologic use of accessory muscles. CARDIOVASCULAR: Heart is regular in rate and rhythm. GI: The abdomen is  soft, nontender, and nondistended. There were no  palpable masses. I did not appreciate hepatosplenomegaly.  GU: No inguinal palpable lymphadenopathy. MUSCULOSKELETAL:  Symmetrical muscle tone appreciated in all four extremities.    SKIN: Skin turgor is normal. No pathologic skin lesions appreciated.  NEUROLOGIC:  Motor and sensation appear grossly normal.  Cranial nerves are grossly without defect. PSYCH:  Alert and oriented to person, place and time. Affect is appropriate for situation.  Data Reviewed I have personally reviewed what is currently available of the patient's imaging, recent labs and medical records.   Labs:     Latest Ref Rng & Units 06/23/2017   11:37 AM 02/27/2014    3:12 AM  CBC  WBC 3.4 - 10.8 x10E3/uL 6.2  10.8   Hemoglobin 13.0 - 17.7 g/dL 14.8  13.5   Hematocrit 37.5 - 51.0 % 41.8  40.7   Platelets 150 - 379 x10E3/uL 209  171       Latest Ref Rng & Units 06/23/2017   11:37 AM 02/27/2014    3:12 AM  CMP  Glucose 65 - 99 mg/dL 80  111   BUN 6 - 24 mg/dL 11  20   Creatinine 0.76 - 1.27 mg/dL 0.75  1.00   Sodium 134 - 144 mmol/L 143  139   Potassium 3.5 - 5.2 mmol/L 4.1  3.9   Chloride 96 - 106 mmol/L 100  104   CO2 20 - 29 mmol/L 25  29   Calcium 8.7 - 10.2 mg/dL 10.3  9.1   Total Protein 6.0 - 8.5 g/dL 7.5  7.5   Total Bilirubin 0.0 - 1.2 mg/dL 0.4  0.4   Alkaline Phos 39 - 117 IU/L 108  109   AST 0 - 40 IU/L 21  20   ALT 0 - 44 IU/L 23  34       Imaging: Radiology review:  Impression  Bilateral slightly enlarged axillary lymph nodes with normal morphology.  These may represent reactive lymph nodes. Clinical correlation is  recommended.  Otherwise, no obvious evidence of focal lesions or enlarged nodes with  abnormal morphology. Narrative  EXAM: [place] AXILLARY BILATERAL  DATE: 08/16/2022 2:06 PM  ACCESSION: [id number]  DICTATED: 08/16/2022 1:54 PM  INTERPRETATION LOCATION: [place]   CLINICAL INDICATION: 52 years old Male with mass on both sides  - R22.9 -  Localized superficial  swelling, mass, or lump     COMPARISON: None   TECHNIQUE: Ultrasound static and cine images of the bilateral axilla.   FINDINGS:  Multiple reniform lymph nodes with thin hypoechoic cortex, prominent hilar  fat. The largest on the right and left measure 1.2 and 1.1 cm in short  axis respectively. Exam End: 08/16/22 14:06   Specimen Collected: 08/16/22 13:54 Last Resulted: 08/16/22 18:33  Received From: [facility]    Within last 24 hrs: No results found.  Assessment    Bilateral slightly enlarged axillary lymph nodes unable to appreciate clinical exam. These likely represent reactive lymph nodes.   Patient Active Problem List   Diagnosis Date Noted   History of colonic polyps 09/19/2022   Cecal polyp 09/19/2022   Polyp of descending colon 09/19/2022   Screening  for colon cancer 09/19/2022   Trigger thumb, right thumb 04/23/2022   Low back pain 08/25/2017   Sciatica 08/25/2017   IBS (irritable bowel syndrome) 08/25/2017   Elevated blood pressure reading 11/26/2016   Impingement syndrome of right shoulder 10/18/2015   Gastro-esophageal reflux disease without esophagitis 08/16/2015   Plantar fasciitis 08/16/2015   Psoriasis of scalp 08/16/2015   Calculus of kidney 02/07/2015    Plan    We discussed options of proceeding with needle biopsy, open biopsy and the benefit of continued observation.  I believe it would be prudent to continue to observe for 3 months, considering there are no associated other adenopathy, or potential causation of their reactivity. I would repeat bilateral axillary ultrasound at that time.  We will glad least see him sooner should anything change. Options of further imaging with chest/abdominal CT, chest x-ray considered. Face-to-face time spent with the patient and accompanying care providers(if present) was 30 minutes, with more than 50% of the time spent counseling, educating, and coordinating care of the patient.    These notes generated with  voice recognition software. I apologize for typographical errors.  Campbell Lerner M.D., FACS 11/25/2022, 1:00 PM

## 2022-11-26 ENCOUNTER — Ambulatory Visit: Admitting: Surgery

## 2023-02-04 ENCOUNTER — Ambulatory Visit: Admitting: Internal Medicine

## 2023-02-04 ENCOUNTER — Encounter: Payer: Self-pay | Admitting: Internal Medicine

## 2023-02-04 VITALS — BP 128/78 | HR 80 | Ht 73.0 in | Wt 244.0 lb

## 2023-02-04 DIAGNOSIS — R7302 Impaired glucose tolerance (oral): Secondary | ICD-10-CM

## 2023-02-04 DIAGNOSIS — M25561 Pain in right knee: Secondary | ICD-10-CM | POA: Diagnosis not present

## 2023-02-04 DIAGNOSIS — M25551 Pain in right hip: Secondary | ICD-10-CM | POA: Diagnosis not present

## 2023-02-04 DIAGNOSIS — R030 Elevated blood-pressure reading, without diagnosis of hypertension: Secondary | ICD-10-CM | POA: Diagnosis not present

## 2023-02-04 DIAGNOSIS — G8929 Other chronic pain: Secondary | ICD-10-CM | POA: Insufficient documentation

## 2023-02-04 DIAGNOSIS — M25562 Pain in left knee: Secondary | ICD-10-CM

## 2023-02-04 DIAGNOSIS — E782 Mixed hyperlipidemia: Secondary | ICD-10-CM

## 2023-02-04 DIAGNOSIS — M25552 Pain in left hip: Secondary | ICD-10-CM

## 2023-02-04 MED ORDER — METHYLPREDNISOLONE 4 MG PO TBPK: ORAL_TABLET | ORAL | 0 refills | Status: DC

## 2023-02-04 MED ORDER — CELECOXIB 200 MG PO CAPS: 200.0000 mg | ORAL_CAPSULE | Freq: Every day | ORAL | 0 refills | Status: DC

## 2023-02-04 NOTE — Progress Notes (Signed)
Established Patient Office Visit  Subjective:  Patient ID: Jeffery Collins, male    DOB: [birth year]  Age: 52 y.o. MRN: [id number]  Chief Complaint  Patient presents with   Follow-up    Right knee pain    Patient comes in for his follow-up today.  He has missed his previous appointments and due to some family issues.  He is fasting for his blood work.  He states that he is getting testosterone treatment from his urologist but he thinks he needs to increase the dose.  Will be contacting his urologist office for a follow-up. Patient reports that about 10 days ago he had to go through 4 days of intense physical training.  And since then his multiple joints are aching, stiff, tender as well as swollen.  Patient has taken  over-the-counter Advil which usually helps but this time  his symptoms are not going away and he is not able to resume his regular physical exercise routine.  Patient does not have any family history of inflammatory arthritis. He does not complain of any fevers or chills, no nausea vomiting, no cough or shortness of breath.    No other concerns at this time.   Past Medical History:  Diagnosis Date   GAD (generalized anxiety disorder)    Hypogonadism in male    Impaired glucose tolerance    Kidney stones    Mixed hyperlipidemia    Pre-hypertension     Past Surgical History:  Procedure Laterality Date   COLONOSCOPY  2005   COLONOSCOPY WITH PROPOFOL N/A 09/19/2022   Procedure: COLONOSCOPY WITH PROPOFOL;  Surgeon: Lin Landsman, MD;  Location: [facility] ENDOSCOPY;  Service: Gastroenterology;  Laterality: N/A;   LITHOTRIPSY  02/2014    Social History   Socioeconomic History   Marital status: Married    Spouse name: Not on file   Number of children: Not on file   Years of education: Not on file   Highest education level: Not on file  Occupational History   Not on file  Tobacco Use   Smoking status: Former    Types: Cigarettes   Smokeless tobacco: Never    Tobacco comments:    stopped 2000  Vaping Use   Vaping Use: Never used  Substance and Sexual Activity   Alcohol use: Yes    Alcohol/week: 2.0 standard drinks of alcohol    Types: 2 Standard drinks or equivalent per week   Drug use: No   Sexual activity: Yes  Other Topics Concern   Not on file  Social History Narrative   Not on file   Social Determinants of Health   Financial Resource Strain: Not on file  Food Insecurity: Not on file  Transportation Needs: Not on file  Physical Activity: Not on file  Stress: Not on file  Social Connections: Not on file  Intimate Partner Violence: Not on file    Family History  Problem Relation Age of Onset   Diabetes Mother    Lung cancer Father     No Known Allergies  Review of Systems  Constitutional:  Positive for malaise/fatigue. Negative for chills, diaphoresis, fever and weight loss.  HENT: Negative.    Eyes: Negative.   Respiratory:  Negative for cough, sputum production, shortness of breath and wheezing.   Cardiovascular:  Negative for chest pain, palpitations, orthopnea and leg swelling.  Gastrointestinal:  Negative for abdominal pain, constipation, diarrhea, heartburn, nausea and vomiting.  Genitourinary:  Negative for dysuria, frequency and urgency.  Musculoskeletal:  Positive for back pain, joint pain and myalgias. Negative for falls and neck pain.  Skin: Negative.   Neurological: Negative.   Psychiatric/Behavioral: Negative.         Objective:   BP 128/78   Pulse 80   Ht 6\' 1"  (1.854 m)   Wt 244 lb (110.7 kg)   SpO2 96%   BMI 32.19 kg/m   Vitals:   02/04/23 0942  BP: 128/78  Pulse: 80  Height: 6\' 1"  (1.854 m)  Weight: 244 lb (110.7 kg)  SpO2: 96%  BMI (Calculated): 32.2    Physical Exam Vitals and nursing note reviewed.  Constitutional:      Appearance: Normal appearance.  Cardiovascular:     Rate and Rhythm: Normal rate and regular rhythm.  Pulmonary:     Effort: Pulmonary effort is normal.      Breath sounds: Normal breath sounds.  Abdominal:     General: Abdomen is flat.     Palpations: Abdomen is soft.  Musculoskeletal:        General: Tenderness (small joints of both hands, both knees, and hips.) present.     Cervical back: Normal range of motion.  Skin:    General: Skin is warm.  Neurological:     General: No focal deficit present.     Mental Status: He is alert and oriented to person, place, and time.      No results found for any visits on 02/04/23.  No results found for this or any previous visit (from the past 2160 hour(s)).    Assessment & Plan:  Patient with multiple joint pains.  Suspect overexertion over a short of time.  Patient is advised to rest and to resume gentle exercises.  However will prescribe a Medrol Dosepak.  Patient will start Celebrex after completing the Dosepak.  Blood work also to be done today. 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   Relevant Medications   DULoxetine (CYMBALTA) 60 MG capsule   methylPREDNISolone (MEDROL DOSEPAK) 4 MG TBPK tablet   celecoxib (CELEBREX) 200 MG capsule   Other Relevant Orders   CBC With Differential   Arthritis Panel   Acute pain of both knees   Relevant Medications   methylPREDNISolone (MEDROL DOSEPAK) 4 MG TBPK tablet   Other Relevant Orders   Arthritis Panel   Impaired glucose tolerance   Relevant Orders   CMP14+EGFR   Hemoglobin A1c   Mixed hyperlipidemia   Relevant Medications   fenofibrate (TRICOR) 145 MG tablet   Other Relevant Orders   CMP14+EGFR   Lipid Panel w/o Chol/HDL Ratio    Return in about 3 weeks (around 02/25/2023).   Total time spent: 30 minutes  Perrin Maltese, MD  02/04/2023

## 2023-02-07 ENCOUNTER — Other Ambulatory Visit: Payer: Self-pay | Admitting: Internal Medicine

## 2023-02-07 DIAGNOSIS — G8929 Other chronic pain: Secondary | ICD-10-CM

## 2023-02-17 ENCOUNTER — Other Ambulatory Visit: Payer: Self-pay | Admitting: Internal Medicine

## 2023-02-25 ENCOUNTER — Ambulatory Visit: Admitting: Surgery

## 2023-03-06 ENCOUNTER — Ambulatory Visit: Admitting: Internal Medicine

## 2023-03-15 ENCOUNTER — Other Ambulatory Visit: Payer: Self-pay | Admitting: Internal Medicine

## 2023-04-10 ENCOUNTER — Ambulatory Visit
Admission: RE | Admit: 2023-04-10 | Discharge: 2023-04-10 | Disposition: A | Discharge status: Home / Self Care | Source: Ambulatory Visit | Attending: Internal Medicine | Admitting: Internal Medicine

## 2023-04-10 ENCOUNTER — Encounter: Payer: Self-pay | Admitting: Internal Medicine

## 2023-04-10 ENCOUNTER — Ambulatory Visit
Admission: RE | Admit: 2023-04-10 | Discharge: 2023-04-10 | Disposition: A | Discharge status: Home / Self Care | Attending: Internal Medicine | Admitting: Internal Medicine

## 2023-04-10 ENCOUNTER — Ambulatory Visit: Admitting: Internal Medicine

## 2023-04-10 VITALS — BP 150/105 | HR 105 | Ht 73.0 in | Wt 244.4 lb

## 2023-04-10 DIAGNOSIS — K219 Gastro-esophageal reflux disease without esophagitis: Secondary | ICD-10-CM | POA: Diagnosis not present

## 2023-04-10 DIAGNOSIS — M25552 Pain in left hip: Secondary | ICD-10-CM

## 2023-04-10 DIAGNOSIS — S8991XA Unspecified injury of right lower leg, initial encounter: Secondary | ICD-10-CM | POA: Diagnosis present

## 2023-04-10 DIAGNOSIS — M25551 Pain in right hip: Secondary | ICD-10-CM

## 2023-04-10 DIAGNOSIS — M25561 Pain in right knee: Secondary | ICD-10-CM | POA: Diagnosis not present

## 2023-04-10 DIAGNOSIS — R030 Elevated blood-pressure reading, without diagnosis of hypertension: Secondary | ICD-10-CM

## 2023-04-10 DIAGNOSIS — G8929 Other chronic pain: Secondary | ICD-10-CM

## 2023-04-10 DIAGNOSIS — E782 Mixed hyperlipidemia: Secondary | ICD-10-CM

## 2023-04-10 DIAGNOSIS — M25562 Pain in left knee: Secondary | ICD-10-CM

## 2023-04-10 MED ORDER — CELECOXIB 200 MG PO CAPS: 200.0000 mg | ORAL_CAPSULE | Freq: Every day | ORAL | 3 refills | Status: DC

## 2023-04-10 MED ORDER — PREDNISONE 20 MG PO TABS: 40.0000 mg | ORAL_TABLET | Freq: Every day | ORAL | 0 refills | Status: DC

## 2023-04-10 NOTE — Progress Notes (Addendum)
Established Patient Office Visit  Subjective:  Patient ID: Jeffery Collins, male    DOB: [birth year]  Age: 52 y.o. MRN: [id number]  Chief Complaint  Patient presents with   Acute Visit    Right knee pain and lower back pain    Patient comes in with complaints of acute right knee pain.  While at work there was an incidence where he had to quickly jump backwards and in the process his twisted his right knee and  heard a pop.  He did not see any swelling or bruising but he has been icing it for the last 5 days, wore a knee brace and took OTC ibuprofen.   Currently it still very painful and tender especially on the medial side of the right knee joint.  He is able to put weight on it but has has trouble with certain movements. Patient has history of chronic arthralgias.  He still has not done his blood work yet, and did not return after his previous visit which was in March. His blood pressure is very high today but he thinks it is due to his knee pain.  Needs to be monitored and the medicine will be started if it still high. Will check x-ray of his right knee and start appropriate treatment.    No other concerns at this time.   Past Medical History:  Diagnosis Date   GAD (generalized anxiety disorder)    Hypogonadism in male    Impaired glucose tolerance    Kidney stones    Mixed hyperlipidemia    Pre-hypertension     Past Surgical History:  Procedure Laterality Date   COLONOSCOPY  2005   COLONOSCOPY WITH PROPOFOL N/A 09/19/2022   Procedure: COLONOSCOPY WITH PROPOFOL;  Surgeon: Toney Reil, MD;  Location: [facility] ENDOSCOPY;  Service: Gastroenterology;  Laterality: N/A;   LITHOTRIPSY  02/2014    Social History   Socioeconomic History   Marital status: Married    Spouse name: Not on file   Number of children: Not on file   Years of education: Not on file   Highest education level: Not on file  Occupational History   Not on file  Tobacco Use   Smoking status:  Former    Types: Cigarettes   Smokeless tobacco: Never   Tobacco comments:    stopped 2000  Vaping Use   Vaping Use: Never used  Substance and Sexual Activity   Alcohol use: Yes    Alcohol/week: 2.0 standard drinks of alcohol    Types: 2 Standard drinks or equivalent per week   Drug use: No   Sexual activity: Yes  Other Topics Concern   Not on file  Social History Narrative   Not on file   Social Determinants of Health   Financial Resource Strain: Not on file  Food Insecurity: Not on file  Transportation Needs: Not on file  Physical Activity: Not on file  Stress: Not on file  Social Connections: Not on file  Intimate Partner Violence: Not on file    Family History  Problem Relation Age of Onset   Diabetes Mother    Lung cancer Father     No Known Allergies  Review of Systems  Constitutional:  Negative for chills, diaphoresis, fever, malaise/fatigue and weight loss.  HENT: Negative.    Eyes: Negative.   Respiratory:  Negative for cough, shortness of breath and wheezing.   Cardiovascular:  Negative for chest pain, claudication, leg swelling and PND.  Gastrointestinal:  Negative for abdominal pain, blood in stool, heartburn, nausea and vomiting.  Genitourinary:  Negative for dysuria, frequency and urgency.  Musculoskeletal:  Positive for back pain, joint pain and myalgias.  Neurological:  Negative for dizziness, sensory change, focal weakness and headaches.  Psychiatric/Behavioral:  Negative for depression. The patient is nervous/anxious.        Objective:   BP (!) 150/105   Pulse (!) 105   Ht 6\' 1"  (1.854 m)   Wt 244 lb 6.4 oz (110.9 kg)   SpO2 98%   BMI 32.24 kg/m   Vitals:   04/10/23 1505  BP: (!) 150/105  Pulse: (!) 105  Height: 6\' 1"  (1.854 m)  Weight: 244 lb 6.4 oz (110.9 kg)  SpO2: 98%  BMI (Calculated): 32.25    Physical Exam Vitals and nursing note reviewed.  Constitutional:      General: He is not in acute distress.    Appearance:  Normal appearance. He is not toxic-appearing.  Cardiovascular:     Rate and Rhythm: Normal rate and regular rhythm.     Pulses: Normal pulses.     Heart sounds: No murmur heard. Pulmonary:     Effort: Pulmonary effort is normal.     Breath sounds: Normal breath sounds. No rhonchi or rales.  Abdominal:     General: Bowel sounds are normal.     Palpations: Abdomen is soft. There is no mass.     Tenderness: There is no abdominal tenderness. There is no right CVA tenderness or left CVA tenderness.  Musculoskeletal:        General: Tenderness (rt medial knee) and signs of injury present. No swelling or deformity.     Cervical back: Normal range of motion and neck supple. No rigidity.     Right lower leg: No edema.     Left lower leg: No edema.  Skin:    General: Skin is warm.  Neurological:     General: No focal deficit present.     Mental Status: He is alert and oriented to person, place, and time.  Psychiatric:        Mood and Affect: Mood normal.        Behavior: Behavior normal.      No results found for any visits on 04/10/23.  No results found for this or any previous visit (from the past 2160 hour(s)).    Assessment & Plan:  X-ray of the right knee does not show any acute fractures.  Will start treatment with prednisone burst followed by Celebrex.  Patient will continue icing it and wear a knee brace as needed.  If it is not better by next visit we will consider orthopedic consult and a possible MRI for internal derangement of the right knee joint. Patient is also supposed to get his lab work done which has been previously ordered. Monitor blood pressure, will need blood pressure medicines if it still high. Problem List Items Addressed This Visit     Gastro-esophageal reflux disease without esophagitis   Pre-hypertension   Chronic arthralgias of knees and hips   Relevant Medications   predniSONE (DELTASONE) 20 MG tablet   celecoxib (CELEBREX) 200 MG capsule   Acute  pain of right knee   Relevant Medications   predniSONE (DELTASONE) 20 MG tablet   celecoxib (CELEBREX) 200 MG capsule   Mixed hyperlipidemia   Knee injury, right, initial encounter - Primary   Relevant Orders   DG Knee Complete 4 Views Right (Completed)  Return in about 1 week (around 04/17/2023).   Total time spent: 30 minutes  Margaretann Loveless, MD  04/10/2023   This document may have been prepared by [facility] Voice Recognition software and as such may include unintentional dictation errors.

## 2023-04-17 ENCOUNTER — Ambulatory Visit: Admitting: Internal Medicine

## 2023-04-24 ENCOUNTER — Encounter: Payer: Self-pay | Admitting: Internal Medicine

## 2023-04-24 ENCOUNTER — Ambulatory Visit: Admitting: Internal Medicine

## 2023-04-24 VITALS — BP 130/80 | HR 105 | Ht 73.0 in | Wt 246.0 lb

## 2023-04-24 DIAGNOSIS — F320 Major depressive disorder, single episode, mild: Secondary | ICD-10-CM

## 2023-04-24 DIAGNOSIS — E782 Mixed hyperlipidemia: Secondary | ICD-10-CM

## 2023-04-24 DIAGNOSIS — K219 Gastro-esophageal reflux disease without esophagitis: Secondary | ICD-10-CM | POA: Diagnosis not present

## 2023-04-24 DIAGNOSIS — S8991XD Unspecified injury of right lower leg, subsequent encounter: Secondary | ICD-10-CM | POA: Diagnosis not present

## 2023-04-24 DIAGNOSIS — R030 Elevated blood-pressure reading, without diagnosis of hypertension: Secondary | ICD-10-CM

## 2023-04-24 NOTE — Progress Notes (Signed)
Established Patient Office Visit  Subjective:  Patient ID: Jeffery Collins, male    DOB: 04-Mar-1971  Age: 52 y.o. MRN: [id number]  Chief Complaint  Patient presents with   Follow-up    1 week follow up    Patient comes in for follow-up of his right knee pain.  At his last visit he had presented with history of injury to his right knee.  X-ray was done which showed no fracture.  Patient was treated with prednisone followed by Celebrex.  His pain is completely resolved.  His blood pressure is also looking much better today.  Patient is fasting for blood work. He mentions he is feeling more stressed and depressed in these days due to pressure at work.  He is currently on 60 mg of Cymbalta per day which she says he is taking it regularly.  Patient agrees to talk to a psychiatrist now.  Will set up a referral. No other complaints.    No other concerns at this time.   Past Medical History:  Diagnosis Date   GAD (generalized anxiety disorder)    Hypogonadism in male    Impaired glucose tolerance    Kidney stones    Mixed hyperlipidemia    Pre-hypertension     Past Surgical History:  Procedure Laterality Date   COLONOSCOPY  2005   COLONOSCOPY WITH PROPOFOL N/A 09/19/2022   Procedure: COLONOSCOPY WITH PROPOFOL;  Surgeon: Toney Reil, MD;  Location: [facility] ENDOSCOPY;  Service: Gastroenterology;  Laterality: N/A;   LITHOTRIPSY  02/2014    Social History   Socioeconomic History   Marital status: Married    Spouse name: Not on file   Number of children: Not on file   Years of education: Not on file   Highest education level: Not on file  Occupational History   Not on file  Tobacco Use   Smoking status: Former    Types: Cigarettes   Smokeless tobacco: Never   Tobacco comments:    stopped 2000  Vaping Use   Vaping Use: Never used  Substance and Sexual Activity   Alcohol use: Yes    Alcohol/week: 2.0 standard drinks of alcohol    Types: 2 Standard drinks or  equivalent per week   Drug use: No   Sexual activity: Yes  Other Topics Concern   Not on file  Social History Narrative   Not on file   Social Determinants of Health   Financial Resource Strain: Not on file  Food Insecurity: Not on file  Transportation Needs: Not on file  Physical Activity: Not on file  Stress: Not on file  Social Connections: Not on file  Intimate Partner Violence: Not on file    Family History  Problem Relation Age of Onset   Diabetes Mother    Lung cancer Father     No Known Allergies  Review of Systems  Constitutional:  Positive for malaise/fatigue. Negative for chills, diaphoresis and weight loss.  HENT:  Negative for ear discharge, hearing loss, nosebleeds and sore throat.   Eyes: Negative.   Respiratory:  Negative for cough, shortness of breath, wheezing and stridor.   Cardiovascular:  Negative for chest pain, claudication and leg swelling.  Gastrointestinal:  Negative for abdominal pain, blood in stool, heartburn, melena, nausea and vomiting.  Genitourinary: Negative.   Musculoskeletal:  Negative for back pain, falls, joint pain, myalgias and neck pain.  Skin: Negative.   Neurological:  Negative for dizziness, tremors, sensory change, speech change, loss of  consciousness, weakness and headaches.  Psychiatric/Behavioral:  Positive for depression. Negative for hallucinations, memory loss, substance abuse and suicidal ideas. The patient is not nervous/anxious and does not have insomnia.        Objective:   BP 130/80   Pulse (!) 105   Ht 6\' 1"  (1.854 m)   Wt 246 lb (111.6 kg)   SpO2 96%   BMI 32.46 kg/m   Vitals:   04/24/23 1541  BP: 130/80  Pulse: (!) 105  Height: 6\' 1"  (1.854 m)  Weight: 246 lb (111.6 kg)  SpO2: 96%  BMI (Calculated): 32.46    Physical Exam Vitals and nursing note reviewed.  Constitutional:      Appearance: Normal appearance.  HENT:     Head: Normocephalic and atraumatic.  Neck:     Vascular: No carotid  bruit.  Cardiovascular:     Rate and Rhythm: Normal rate and regular rhythm.     Pulses: Normal pulses.     Heart sounds: Normal heart sounds. No murmur heard. Pulmonary:     Effort: Pulmonary effort is normal.     Breath sounds: Normal breath sounds. No rhonchi or rales.  Abdominal:     General: Bowel sounds are normal.     Palpations: Abdomen is soft. There is no mass.     Tenderness: There is no abdominal tenderness. There is no right CVA tenderness, left CVA tenderness or guarding.  Musculoskeletal:        General: No swelling, tenderness, deformity or signs of injury. Normal range of motion.     Cervical back: Normal range of motion and neck supple.     Right lower leg: No edema.     Left lower leg: No edema.  Lymphadenopathy:     Cervical: No cervical adenopathy.  Skin:    General: Skin is warm and dry.  Neurological:     General: No focal deficit present.     Mental Status: He is alert and oriented to person, place, and time.  Psychiatric:        Mood and Affect: Mood normal.        Behavior: Behavior normal.      No results found for any visits on 04/24/23.  No results found for this or any previous visit (from the past 2160 hour(s)).    Assessment & Plan:  Patient will continue taking his Celebrex as needed.  Monitor blood pressure at home.  Check his fasting blood work today.  Referral sent to psychiatrist.  Meanwhile continue Cymbalta. Problem List Items Addressed This Visit     Gastro-esophageal reflux disease without esophagitis   Pre-hypertension   Mixed hyperlipidemia   Knee injury, right, initial encounter - Primary   Current mild episode of major depressive disorder (HCC)   Relevant Orders   Ambulatory referral to Psychiatry    Return in about 3 months (around 07/25/2023).   Total time spent: 30 minutes  Margaretann Loveless, MD  04/24/2023   This document may have been prepared by [facility] Voice Recognition software and as such may include  unintentional dictation errors.

## 2023-04-30 ENCOUNTER — Encounter: Payer: Self-pay | Admitting: Emergency Medicine

## 2023-04-30 ENCOUNTER — Other Ambulatory Visit: Payer: Self-pay

## 2023-04-30 ENCOUNTER — Emergency Department

## 2023-04-30 ENCOUNTER — Emergency Department
Admission: EM | Admit: 2023-04-30 | Discharge: 2023-04-30 | Disposition: A | Discharge status: Home / Self Care | Attending: Emergency Medicine | Admitting: Emergency Medicine

## 2023-04-30 DIAGNOSIS — N132 Hydronephrosis with renal and ureteral calculous obstruction: Secondary | ICD-10-CM | POA: Diagnosis not present

## 2023-04-30 DIAGNOSIS — R109 Unspecified abdominal pain: Secondary | ICD-10-CM | POA: Diagnosis present

## 2023-04-30 DIAGNOSIS — N23 Unspecified renal colic: Secondary | ICD-10-CM

## 2023-04-30 DIAGNOSIS — N200 Calculus of kidney: Secondary | ICD-10-CM

## 2023-04-30 LAB — URINALYSIS, W/ REFLEX TO CULTURE (INFECTION SUSPECTED)
Bilirubin Urine: NEGATIVE
Glucose, UA: NEGATIVE mg/dL
Ketones, ur: NEGATIVE mg/dL
Leukocytes,Ua: NEGATIVE
Nitrite: NEGATIVE
Protein, ur: NEGATIVE mg/dL
Specific Gravity, Urine: 1.005 (ref 1.005–1.030)
Squamous Epithelial / HPF: NONE SEEN /HPF (ref 0–5)
pH: 8 (ref 5.0–8.0)

## 2023-04-30 LAB — BASIC METABOLIC PANEL
Anion gap: 11 (ref 5–15)
BUN: 14 mg/dL (ref 6–20)
CO2: 24 mmol/L (ref 22–32)
Calcium: 8.9 mg/dL (ref 8.9–10.3)
Chloride: 103 mmol/L (ref 98–111)
Creatinine, Ser: 1.13 mg/dL (ref 0.61–1.24)
GFR, Estimated: >60 mL/min (ref >60)
Glucose, Bld: 135 mg/dL — ABNORMAL HIGH (ref 70–99)
Potassium: 3.9 mmol/L (ref 3.5–5.1)
Sodium: 138 mmol/L (ref 135–145)

## 2023-04-30 LAB — CBC
HCT: 42 % (ref 39.0–52.0)
Hemoglobin: 14 g/dL (ref 13.0–17.0)
MCH: 28.3 pg (ref 26.0–34.0)
MCHC: 33.3 g/dL (ref 30.0–36.0)
MCV: 84.8 fL (ref 80.0–100.0)
Platelets: 247 10*3/uL (ref 150–400)
RBC: 4.95 MIL/uL (ref 4.22–5.81)
RDW: 13.2 % (ref 11.5–15.5)
WBC: 8.6 10*3/uL (ref 4.0–10.5)
nRBC: 0 % (ref 0.0–0.2)

## 2023-04-30 MED ORDER — TAMSULOSIN HCL 0.4 MG PO CAPS: 0.4000 mg | ORAL_CAPSULE | Freq: Every day | ORAL | 0 refills | Status: AC

## 2023-04-30 MED ORDER — NAPROXEN 500 MG PO TABS: 500.0000 mg | ORAL_TABLET | Freq: Two times a day (BID) | ORAL | 0 refills | Status: AC

## 2023-04-30 NOTE — ED Notes (Signed)
Patient ambulated to hallway bathroom with a steady gait. 

## 2023-04-30 NOTE — Discharge Instructions (Addendum)
You may take the Flomax to help with your pain.  As we discussed, your stone is approximately 5 mm which is on the upper limit of what you are able to pass.  There is no infection in your urine.  Please follow-up with urology this week for recheck.  If your pain worsens, or if you develop fevers, nausea, vomiting, or burning when you pee, please return to the emergency department immediately.  It was a pleasure caring for you today.

## 2023-04-30 NOTE — ED Notes (Signed)
Pt to ED via [facility]. Pt c/o back and groin pain, troubling urinating and nausea. Pt was double over in pain so MD at [facility] sent him here for evaluation. Pt did have UA done at [facility] clinic that showed RBCs in his urine.

## 2023-04-30 NOTE — ED Triage Notes (Signed)
Pt to ED via [facility] for flank pain. Pt states that the pain initially started yesterday. Pt states that he drink 2 large waters and urinated several times and the pain subsided. Pt states that the pain started back this morning. Pt states that the pain was severe on his way to the ED, pt has now subsided. Pt states that he is also having trouble with his urine stream.

## 2023-04-30 NOTE — ED Notes (Signed)
UA from [facility] in chart

## 2023-04-30 NOTE — ED Provider Notes (Signed)
[facility] Provider Note    Event Date/Time   First MD Initiated Contact with Patient 04/30/23 1158     (approximate)   History   Flank Pain   HPI  Luiz Trumpower is a 52 y.o. male with a past medical history of kidney stones, hyperlipidemia, depression who presents today for evaluation of left-sided flank pain.  Patient reports that this pain began yesterday and was associated with nausea and vomiting about the same as his previous kidney stones.  He reports that he went to [facility] and give a urine sample, and thinks that he passed the stone.  He was instructed to come to the emergency department.  Patient reports that his pain has resolved.  He no longer has nausea or vomiting either.  No difficulty urinating.  No fevers or chills.  Patient Active Problem List   Diagnosis Date Noted   Current mild episode of major depressive disorder (HCC) 04/24/2023   Knee injury, right, initial encounter 04/10/2023   Chronic arthralgias of knees and hips 02/04/2023   Acute pain of right knee 02/04/2023   Impaired glucose tolerance 02/04/2023   Mixed hyperlipidemia 02/04/2023   History of colonic polyps 09/19/2022   Cecal polyp 09/19/2022   Polyp of descending colon 09/19/2022   Screening for colon cancer 09/19/2022   Trigger thumb, right thumb 04/23/2022   Low back pain 08/25/2017   Sciatica 08/25/2017   IBS (irritable bowel syndrome) 08/25/2017   Pre-hypertension 11/26/2016   Impingement syndrome of right shoulder 10/18/2015   Gastro-esophageal reflux disease without esophagitis 08/16/2015   Plantar fasciitis 08/16/2015   Psoriasis of scalp 08/16/2015   Calculus of kidney 02/07/2015          Physical Exam   Triage Vital Signs: ED Triage Vitals  Enc Vitals Group     BP 04/30/23 1036 (!) 162/100     Pulse Rate 04/30/23 1036 71     Resp 04/30/23 1036 16     Temp 04/30/23 1036 97.8 F (36.6 C)     Temp Source 04/30/23 1036 Oral     SpO2  04/30/23 1036 100 %     Weight 04/30/23 1039 235 lb (106.6 kg)     Height 04/30/23 1039 6\' 1"  (1.854 m)     Head Circumference --      Peak Flow --      Pain Score 04/30/23 1039 3     Pain Loc --      Pain Edu? --      Excl. in GC? --     Most recent vital signs: Vitals:   04/30/23 1036 04/30/23 1302  BP: (!) 162/100 (!) 158/98  Pulse: 71 77  Resp: 16 16  Temp: 97.8 F (36.6 C) 98 F (36.7 C)  SpO2: 100% 100%    Physical Exam Vitals and nursing note reviewed.  Constitutional:      General: Awake and alert. No acute distress.    Appearance: Normal appearance. The patient is normal weight.  HENT:     Head: Normocephalic and atraumatic.     Mouth: Mucous membranes are moist.  Eyes:     General: PERRL. Normal EOMs        Right eye: No discharge.        Left eye: No discharge.     Conjunctiva/sclera: Conjunctivae normal.  Cardiovascular:     Rate and Rhythm: Normal rate and regular rhythm.     Pulses: Normal pulses.  Pulmonary:  Effort: Pulmonary effort is normal. No respiratory distress.     Breath sounds: Normal breath sounds.  Abdominal:     Abdomen is soft. There is no abdominal tenderness. No rebound or guarding. No distention. Musculoskeletal:        General: No swelling. Normal range of motion.     Cervical back: Normal range of motion and neck supple.  Skin:    General: Skin is warm and dry.     Capillary Refill: Capillary refill takes less than 2 seconds.     Findings: No rash.  Neurological:     Mental Status: The patient is awake and alert.      ED Results / Procedures / Treatments   Labs (all labs ordered are listed, but only abnormal results are displayed) Labs Reviewed  BASIC METABOLIC PANEL - Abnormal; Notable for the following components:      Result Value   Glucose, Bld 135 (*)    All other components within normal limits  URINALYSIS, W/ REFLEX TO CULTURE (INFECTION SUSPECTED) - Abnormal; Notable for the following components:   Color,  Urine YELLOW (*)    APPearance CLEAR (*)    Hgb urine dipstick LARGE (*)    Bacteria, UA RARE (*)    All other components within normal limits  CBC     EKG     RADIOLOGY I independently reviewed and interpreted imaging and agree with radiologists findings.     PROCEDURES:  Critical Care performed:   Procedures   MEDICATIONS ORDERED IN ED: Medications - No data to display   IMPRESSION / MDM / ASSESSMENT AND PLAN / ED COURSE  I reviewed the triage vital signs and the nursing notes.   Differential diagnosis includes, but is not limited to, kidney stone, ureteral colic, diverticulitis, urinary tract infection, pyelonephritis.  Patient is awake and alert, hemodynamically stable and afebrile.  He has no abdominal tenderness, no CVA tenderness.  He reports that his pain and nausea level currently both 0.  He believes that he has passed his stone.  Labs obtained in triage are unremarkable.  Urinalysis reveals microscopic blood but no evidence of infection.  CT renal stone obtained to evaluate for stone reveals a 5 mm stone at the UPJ.  Patient was advised of this.  He was advised that he is at the upper limit of a stone that is possible, he would like to try to pass it on his own.  He was given Flomax.  He did not want any analgesia or antiemetic in the emergency department, reports that his pain level is 0.  I recommended that he follow-up with his urologist for reevaluation this week.  We also discussed very strict return precautions the plan.  He was discharged in stable condition.   Patient's presentation is most consistent with acute complicated illness / injury requiring diagnostic workup.    FINAL CLINICAL IMPRESSION(S) / ED DIAGNOSES   Final diagnoses:  Ureteral colic  Kidney stone     Rx / DC Orders   ED Discharge Orders          Ordered    tamsulosin (FLOMAX) 0.4 MG CAPS capsule  Daily        04/30/23 1253    naproxen (NAPROSYN) 500 MG tablet  2 times  daily with meals        04/30/23 1253             Note:  This document was prepared using Dragon voice recognition software and  may include unintentional dictation errors.   Keturah Shavers 04/30/23 1317    Minna Antis, MD 04/30/23 1535

## 2023-06-19 ENCOUNTER — Ambulatory Visit: Admitting: Urology

## 2023-06-19 VITALS — BP 144/96 | HR 82 | Ht 73.0 in | Wt 243.0 lb

## 2023-06-19 DIAGNOSIS — N201 Calculus of ureter: Secondary | ICD-10-CM

## 2023-06-19 DIAGNOSIS — E291 Testicular hypofunction: Secondary | ICD-10-CM

## 2023-06-19 DIAGNOSIS — R350 Frequency of micturition: Secondary | ICD-10-CM

## 2023-06-19 LAB — URINALYSIS, COMPLETE
Bilirubin, UA: NEGATIVE
Glucose, UA: NEGATIVE
Ketones, UA: NEGATIVE
Leukocytes,UA: NEGATIVE
Nitrite, UA: NEGATIVE
Protein,UA: NEGATIVE
Specific Gravity, UA: 1.02 (ref 1.005–1.030)
Urobilinogen, Ur: 1 mg/dL (ref 0.2–1.0)
pH, UA: 7 (ref 5.0–7.5)

## 2023-06-19 LAB — MICROSCOPIC EXAMINATION

## 2023-06-19 NOTE — Progress Notes (Signed)
I,Dina M Abdulla,acting as a scribe for Riki Altes, MD.,have documented all relevant documentation on the behalf of Riki Altes, MD,as directed by  Riki Altes, MD while in the presence of Riki Altes, MD.  06/19/2023 4:57 PM   Idelle Leech [facility] 05-16-1971 [id number]  Referring provider: Margaretann Loveless, MD [address] [place],  [place] [address]  Chief Complaint  Patient presents with   Follow-up    HPI: Jeffery Collins is a 52 y.o. male presenting for follow up of left ureteral calculus.  Previously seen for hypogonadism and started on testosterone gel. History of recurrent stunted disease and seen in the ED at [facility] 04/30/23 with the left renal colic and the CT showednon-obstructing right renal calculi and a 6 mm left proximal ureteral calculus. Started on Tamsulosin and urology follow up was recommended. His job involved a fair amount of traveling and he was seen at the [facility] 06/05/23 complaining of recurrent colic. A CT was repeated and although the images are not available for review, the interpretation was a 6 mm left proximal ureteral calculus and non-obsturcting right renal calculi. He had significant improvement in energy level on testosterone placement, however, ran out recently. He has not had recent blood work.   PMH: Recurrent nephrolithiasis  Surgical History: Past Surgical History:  Procedure Laterality Date   COLONOSCOPY  2005   COLONOSCOPY WITH PROPOFOL N/A 09/19/2022   Procedure: COLONOSCOPY WITH PROPOFOL;  Surgeon: Toney Reil, MD;  Location: [facility] ENDOSCOPY;  Service: Gastroenterology;  Laterality: N/A;   LITHOTRIPSY  02/2014    Home Medications:  Allergies as of 06/19/2023   No Known Allergies      Medication List        Accurate as of June 19, 2023  4:57 PM. If you have any questions, ask your nurse or doctor.          STOP taking these medications    clindamycin 150 MG capsule Commonly known as:  CLEOCIN   predniSONE 20 MG tablet Commonly known as: DELTASONE   Testosterone 10 MG/ACT (2%) Gel Commonly known as: Fortesta       TAKE these medications    celecoxib 200 MG capsule Commonly known as: CELEBREX Take 1 capsule (200 mg total) by mouth daily.   clobetasol ointment 0.05 % Commonly known as: TEMOVATE Apply 1 Application topically 2 (two) times daily.   DULoxetine 60 MG capsule Commonly known as: CYMBALTA TAKE ONE CAPSULE BY MOUTH ONCE DAILY   fenofibrate 145 MG tablet Commonly known as: TRICOR TAKE 1 TABLET BY MOUTH EVERY DAY        Allergies: No Known Allergies  Family History: Family History  Problem Relation Age of Onset   Diabetes Mother    Lung cancer Father     Social History:  reports that he has quit smoking. His smoking use included cigarettes. He has never used smokeless tobacco. He reports current alcohol use of about 2.0 standard drinks of alcohol per week. He reports that he does not use drugs.   Physical Exam: BP (!) 144/96   Pulse 82   Ht 6\' 1"  (1.854 m)   Wt 243 lb (110.2 kg)   BMI 32.06 kg/m   Constitutional:  Alert and oriented, No acute distress. HEENT: [place] AT Respiratory: Normal respiratory effort, no increased work of breathing. Skin: No rashes, bruises or suspicious lesions. Neurologic: Grossly intact, no focal deficits, moving all 4 extremities. Psychiatric: Normal mood and affect.  Pertinent Imaging: CT abdomen/pelvis from [facility], June 2024,  personally reviewed and interpreted   Assessment & Plan:    1.  Left ureteral calculus We discussed various treatment options for urolithiasis including observation with or without medical expulsive therapy, shockwave lithotripsy (SWL), ureteroscopy and laser lithotripsy with stent placement We discussed that management is based on stone size, location, density, patient co-morbidities, and patient preference.  Stones <35mm in size have a >80% spontaneous passage rate. Data  surrounding the use of tamsulosin for medical expulsive therapy is controversial, but meta analyses suggests it is most efficacious for distal stones between 5-66mm in size. Possible side effects include dizziness/lightheadedness, and retrograde ejaculation. SWL has a lower stone free rate in a single procedure, but also a lower complication rate compared to ureteroscopy and avoids a stent and associated stent related symptoms. Possible complications include renal hematoma, steinstrasse, and need for additional treatment. Ureteroscopy with laser lithotripsy and stent placement has a higher stone free rate than SWL in a single procedure, however increased complication rate including possible infection, ureteral injury, bleeding, and stent related morbidity. Common stent related symptoms include dysuria, urgency/frequency, and flank pain. After an extensive discussion of the risks and benefits of the above treatment options, the patient would like to proceed with medical expulsion therapy.  2.  Hypogonadism Testosterone level and hematocrit drawn. Will send Rx refill testosterone gel.  I have reviewed the above documentation for accuracy and completeness, and I agree with the above.   Riki Altes, MD  [facility] [address] [place], [place] [address] [phone]

## 2023-06-20 ENCOUNTER — Telehealth: Payer: Self-pay

## 2023-06-20 ENCOUNTER — Other Ambulatory Visit: Payer: Self-pay | Admitting: Urology

## 2023-06-20 MED ORDER — TESTOSTERONE 20.25 MG/ACT (1.62%) TD GEL: TRANSDERMAL | 2 refills | Status: DC

## 2023-06-20 NOTE — Telephone Encounter (Signed)
Pt called triage line stating his pharmacy never got the testosterone prescription. I called pt to let him know it was now sent. LVM for pt to return call

## 2023-06-22 ENCOUNTER — Encounter: Payer: Self-pay | Admitting: Urology

## 2023-06-24 ENCOUNTER — Telehealth: Payer: Self-pay | Admitting: Urology

## 2023-06-24 NOTE — Telephone Encounter (Signed)
Sent to plan today . Waiting for the response.

## 2023-06-24 NOTE — Telephone Encounter (Signed)
Pt called pharmacy concerning testosterone and was told they needed a PA in order for [employer] to cover.

## 2023-07-16 ENCOUNTER — Telehealth: Payer: Self-pay | Admitting: Urology

## 2023-07-16 DIAGNOSIS — N201 Calculus of ureter: Secondary | ICD-10-CM

## 2023-07-16 NOTE — Telephone Encounter (Signed)
Pt LMOM asking for a return call to have surgery (litho) scheduled.

## 2023-07-17 NOTE — Telephone Encounter (Signed)
LV 06/19/23 with [facility]  CT- 6 MM left ureteral stone- pt opted to for medical expulsion.    Pt now would like to schedule Litho.  Pls advise.

## 2023-07-23 ENCOUNTER — Other Ambulatory Visit: Payer: Self-pay

## 2023-07-23 ENCOUNTER — Other Ambulatory Visit: Payer: Self-pay | Admitting: Family

## 2023-07-23 DIAGNOSIS — N201 Calculus of ureter: Secondary | ICD-10-CM

## 2023-07-23 NOTE — Progress Notes (Signed)
ESWL ORDER FORM  Expected date of procedure: Patient preference  Surgeon: TBD  Post op standing: 2 wk PA follow up w/KUB prior  Anticoagulation/Aspirin/NSAID standing order: Hold all 72 hours prior  Anesthesia standing order: MAC  VTE standing: SCD's  Dx: Left Ureteral Stone  Procedure: left Extracorporeal shock wave lithotripsy  CPT : 50590  Standing Order Set:   *NPO after mn, KUB  *NS 151ml/hr, Keflex 500mg  PO, Benadryl 25mg  PO, Valium 10mg  PO, Zofran 4mg  IV    Medications if other than standing orders:   He has not had a KUB so please order to make sure the stone can be seen on plain x-ray.

## 2023-07-25 ENCOUNTER — Ambulatory Visit: Admitting: Internal Medicine

## 2023-07-28 NOTE — Addendum Note (Signed)
Addended by: Letta Kocher A on: 07/28/2023 10:03 AM   Modules accepted: Orders

## 2023-08-18 ENCOUNTER — Ambulatory Visit: Admitting: Psychiatry

## 2023-08-27 MED ORDER — CEPHALEXIN 500 MG PO CAPS
500.0000 mg | ORAL_CAPSULE | Freq: Once | ORAL | Status: AC
  Administered 2023-08-28: 500 mg via ORAL

## 2023-08-27 MED ORDER — SODIUM CHLORIDE 0.9 % IV SOLN: INTRAVENOUS | Status: DC

## 2023-08-27 MED ORDER — ONDANSETRON HCL 4 MG/2ML IJ SOLN
4.0000 mg | Freq: Once | INTRAMUSCULAR | Status: AC
  Administered 2023-08-28: 4 mg via INTRAVENOUS

## 2023-08-27 MED ORDER — DIAZEPAM 5 MG PO TABS
10.0000 mg | ORAL_TABLET | ORAL | Status: AC
  Administered 2023-08-28: 10 mg via ORAL

## 2023-08-27 MED ORDER — DIPHENHYDRAMINE HCL 25 MG PO CAPS
25.0000 mg | ORAL_CAPSULE | ORAL | Status: AC
  Administered 2023-08-28: 25 mg via ORAL

## 2023-08-28 ENCOUNTER — Encounter: Payer: Self-pay | Admitting: Urology

## 2023-08-28 ENCOUNTER — Ambulatory Visit
Admission: RE | Admit: 2023-08-28 | Discharge: 2023-08-28 | Disposition: A | Discharge status: Home / Self Care | Attending: Urology | Admitting: Urology

## 2023-08-28 ENCOUNTER — Other Ambulatory Visit: Payer: Self-pay

## 2023-08-28 ENCOUNTER — Encounter
Admission: RE | Disposition: A | Discharge status: Home / Self Care | Payer: Self-pay | Source: Home / Self Care | Attending: Urology

## 2023-08-28 ENCOUNTER — Ambulatory Visit

## 2023-08-28 DIAGNOSIS — Z87891 Personal history of nicotine dependence: Secondary | ICD-10-CM | POA: Insufficient documentation

## 2023-08-28 DIAGNOSIS — N201 Calculus of ureter: Secondary | ICD-10-CM | POA: Diagnosis present

## 2023-08-28 HISTORY — PX: EXTRACORPOREAL SHOCK WAVE LITHOTRIPSY: SHX1557

## 2023-08-28 SURGERY — LITHOTRIPSY, ESWL
Anesthesia: Moderate Sedation | Laterality: Left

## 2023-08-28 MED ORDER — CHLORHEXIDINE GLUCONATE 0.12 % MT SOLN
OROMUCOSAL | Status: AC
  Filled 2023-08-28: qty 15

## 2023-08-28 MED ORDER — OXYCODONE HCL 5 MG PO TABS
5.0000 mg | ORAL_TABLET | Freq: Four times a day (QID) | ORAL | 0 refills | Status: DC | PRN
Start: 2023-08-28 — End: 2024-03-12

## 2023-08-28 MED ORDER — ONDANSETRON HCL 4 MG/2ML IJ SOLN
INTRAMUSCULAR | Status: AC
  Filled 2023-08-28: qty 2

## 2023-08-28 MED ORDER — DIPHENHYDRAMINE HCL 25 MG PO CAPS
ORAL_CAPSULE | ORAL | Status: AC
  Filled 2023-08-28: qty 1

## 2023-08-28 MED ORDER — TAMSULOSIN HCL 0.4 MG PO CAPS: 0.4000 mg | ORAL_CAPSULE | Freq: Every day | ORAL | 0 refills | Status: AC

## 2023-08-28 MED ORDER — DIAZEPAM 5 MG PO TABS
ORAL_TABLET | ORAL | Status: AC
  Filled 2023-08-28: qty 2

## 2023-08-28 MED ORDER — CEPHALEXIN 500 MG PO CAPS
ORAL_CAPSULE | ORAL | Status: AC
  Filled 2023-08-28: qty 1

## 2023-08-28 NOTE — H&P (Signed)
   Urology H&P   History of Present Illness: Jeffery Collins is a 52 y.o. male initially seen August 2024 with a 6 mm left proximal ureteral calculus.  He was minimally symptomatic and due to his job deferred until now.  Preoperative KUB this morning shows migration of the calculus slightly distal and presently located in the proximal ureter.  He has elected SWL  Past Medical History:  Diagnosis Date   GAD (generalized anxiety disorder)    Hypogonadism in male    Impaired glucose tolerance    Kidney stones    Mixed hyperlipidemia    Pre-hypertension     Past Surgical History:  Procedure Laterality Date   COLONOSCOPY  2005   COLONOSCOPY WITH PROPOFOL N/A 09/19/2022   Procedure: COLONOSCOPY WITH PROPOFOL;  Surgeon: Toney Reil, MD;  Location: [facility] ENDOSCOPY;  Service: Gastroenterology;  Laterality: N/A;   LITHOTRIPSY  02/2014    Home Medications:  Current Meds  Medication Sig   celecoxib (CELEBREX) 200 MG capsule Take 1 capsule (200 mg total) by mouth daily.   clobetasol ointment (TEMOVATE) 0.05 % Apply 1 Application topically 2 (two) times daily.   DULoxetine (CYMBALTA) 60 MG capsule TAKE 1 CAPSULE BY MOUTH DAILY   fenofibrate (TRICOR) 145 MG tablet TAKE 1 TABLET BY MOUTH EVERY DAY   Testosterone 20.25 MG/ACT (1.62%) GEL 1 pump applied to each shoulder daily    Allergies: No Known Allergies  Family History  Problem Relation Age of Onset   Diabetes Mother    Lung cancer Father     Social History:  reports that he has quit smoking. His smoking use included cigarettes. He has never used smokeless tobacco. He reports current alcohol use of about 2.0 standard drinks of alcohol per week. He reports that he does not use drugs.  ROS: A complete review of systems was performed.  All systems are negative except for pertinent findings as noted.  Physical Exam:  Vital signs in last 24 hours: Temp:  [98.7 F (37.1 C)] 98.7 F (37.1 C) (10/10 0635) Pulse Rate:  [97]  97 (10/10 0635) Resp:  [18] 18 (10/10 0635) BP: (146)/(91) 146/91 (10/10 0635) SpO2:  [96 %] 96 % (10/10 0635) Weight:  [409 kg] 112 kg (10/10 0635) Constitutional:  Alert and oriented, No acute distress HEENT: [place] AT Respiratory: Normal respiratory effort, lungs clear bilaterally Psychiatric: Normal mood and affect  Impression/Assessment:  Left proximal ureteral calculus  Plan:  After discussing management options he has elected SWL.  The procedure has been discussed in detail including potential side effects of incomplete fragmentation requiring repeat or alternative procedures.   08/28/2023, 7:15 AM  Irineo Axon,  MD

## 2023-08-28 NOTE — Discharge Instructions (Addendum)
AMBULATORY SURGERY  DISCHARGE INSTRUCTIONS   The drugs that you were given will stay in your system until tomorrow so for the next 24 hours you should not:  Drive an automobile Make any legal decisions Drink any alcoholic beverage  You may resume regular meals tomorrow.  Today it is better to start with liquids and gradually work up to solid foods.  You may eat anything you prefer, but it is better to start with liquids, then soup and crackers, and gradually work up to solid foods.  Please notify your doctor immediately if you have any unusual bleeding, trouble breathing, redness and pain at the surgery site, drainage, fever, or pain not relieved by medication.  Additional Instructions:  Please contact your physician with any problems or Same Day Surgery at [phone], Monday through Friday 6 am to 4 pm, or [place] [facility] number at [phone].  As per the [facility] discharge instructions A prescription for pain medication was sent to your pharmacy A prescription for tamsulosin which will help you pass stone fragments was also sent to your pharmacy Call [facility] at [phone] for pain not controlled with oral medications or fever greater than 101 degrees You will be contacted for a follow-up appointment

## 2023-08-28 NOTE — Interval H&P Note (Signed)
History and Physical Interval Note:  CV: RRR Lungs:clear  08/28/2023 8:22 AM  Jeffery Collins  has presented today for surgery, with the diagnosis of Left Ureteral Stone.  The various methods of treatment have been discussed with the patient and family. After consideration of risks, benefits and other options for treatment, the patient has consented to  Procedure(s): EXTRACORPOREAL SHOCK WAVE LITHOTRIPSY (ESWL) (Left) as a surgical intervention.  The patient's history has been reviewed, patient examined, no change in status, stable for surgery.  I have reviewed the patient's chart and labs.  Questions were answered to the patient's satisfaction.     [name] C [name]

## 2023-08-29 ENCOUNTER — Other Ambulatory Visit: Payer: Self-pay

## 2023-08-29 DIAGNOSIS — N201 Calculus of ureter: Secondary | ICD-10-CM

## 2023-09-12 NOTE — Progress Notes (Deleted)
09/17/2023 11:52 AM   Jeffery Collins 03-30-1971 [id number]  Referring provider: Margaretann Loveless, MD [address] [place],  [place] [address]  Urological history: 1.  Hypogonadism -Contributing factors of age -Testosterone level pending -Hemoglobin/hematocrit pending -testosterone 20.25/ACT, (1.62%), 2 pumps daily  2. BPH with LU TS -PSA pending  3. Nephrolithiasis -Stone composition consists of 40% calcium oxalate monohydrate, 40% calcium phosphorus and 20% calcium oxalate dihydrate -24-hour metabolic risk study showed significant hypercalciuria, high sodium excretion and hyperuricosuria -ESWL (2015)  No chief complaint on file.  HPI: Jeffery Collins is a 52 y.o. male who presents today for follow up.   Previous records reviewed.   He underwent left ESWL on 08/28/2023 with Dr. Lonna Cobb for a left proximal stone.  His post procedural course was as expected and uneventful.    UA ***  KUB ***  PMH: Past Medical History:  Diagnosis Date   GAD (generalized anxiety disorder)    Hypogonadism in male    Impaired glucose tolerance    Kidney stones    Mixed hyperlipidemia    Pre-hypertension     Surgical History: Past Surgical History:  Procedure Laterality Date   COLONOSCOPY  2005   COLONOSCOPY WITH PROPOFOL N/A 09/19/2022   Procedure: COLONOSCOPY WITH PROPOFOL;  Surgeon: Toney Reil, MD;  Location: [facility] ENDOSCOPY;  Service: Gastroenterology;  Laterality: N/A;   EXTRACORPOREAL SHOCK WAVE LITHOTRIPSY Left 08/28/2023   Procedure: EXTRACORPOREAL SHOCK WAVE LITHOTRIPSY (ESWL);  Surgeon: Riki Altes, MD;  Location: [facility] ORS;  Service: Urology;  Laterality: Left;   LITHOTRIPSY  02/2014    Home Medications:  Allergies as of 09/17/2023   No Known Allergies      Medication List        Accurate as of September 12, 2023 11:52 AM. If you have any questions, ask your nurse or doctor.          celecoxib 200 MG capsule Commonly known as:  CELEBREX Take 1 capsule (200 mg total) by mouth daily.   clobetasol ointment 0.05 % Commonly known as: TEMOVATE Apply 1 Application topically 2 (two) times daily.   DULoxetine 60 MG capsule Commonly known as: CYMBALTA TAKE 1 CAPSULE BY MOUTH DAILY   fenofibrate 145 MG tablet Commonly known as: TRICOR TAKE 1 TABLET BY MOUTH EVERY DAY   oxyCODONE 5 MG immediate release tablet Commonly known as: Oxy IR/ROXICODONE Take 1-2 tablets (5-10 mg total) by mouth every 6 (six) hours as needed for severe pain.   tamsulosin 0.4 MG Caps capsule Commonly known as: FLOMAX Take 1 capsule (0.4 mg total) by mouth daily after breakfast.   Testosterone 20.25 MG/ACT (1.62%) Gel 1 pump applied to each shoulder daily        Allergies: No Known Allergies  Family History: Family History  Problem Relation Age of Onset   Diabetes Mother    Lung cancer Father     Social History:  reports that he has quit smoking. His smoking use included cigarettes. He has never used smokeless tobacco. He reports current alcohol use of about 2.0 standard drinks of alcohol per week. He reports that he does not use drugs.  ROS: Pertinent ROS in HPI  Physical Exam: There were no vitals taken for this visit.  Constitutional:  Well nourished. Alert and oriented, No acute distress. HEENT: [place] AT, moist mucus membranes.  Trachea midline Cardiovascular: No clubbing, cyanosis, or edema. Respiratory: Normal respiratory effort, no increased work of breathing. Neurologic: Grossly intact, no focal deficits,  moving all 4 extremities. Psychiatric: Normal mood and affect.  Laboratory Data: Lab Results  Component Value Date   WBC 8.6 04/30/2023   HGB 14.0 04/30/2023   HCT 42.0 04/30/2023   MCV 84.8 04/30/2023   PLT 247 04/30/2023    Lab Results  Component Value Date   CREATININE 1.13 04/30/2023    Urinalysis See EPIC and HPI I have reviewed the labs.   Pertinent Imaging: KUB ***, radiologist interpretation  still pending I have independently reviewed the films.    Assessment & Plan:    1. Left ESWL -UA *** -KUB ***  2. Testosterone deficiency  -testosterone levels are therapeutic/subtherapeutic -H & H *** -continue ***  3. BPH with LUTS -PSA pending  -DRE benign -UA benign -PVR < 300 cc -symptoms - *** -most bothersome symptoms are *** -continue conservative management, avoiding bladder irritants and timed voiding's -Initiate alpha-blocker (***), discussed side effects *** -Initiate 5 alpha reductase inhibitor (***), discussed side effects *** -Continue tamsulosin 0.4 mg daily, alfuzosin 10 mg daily, Rapaflo 8 mg daily, terazosin, doxazosin, Cialis 5 mg daily and finasteride 5 mg daily, dutasteride 0.5 mg daily***:refills given -Cannot tolerate medication or medication failure, schedule cystoscopy ***  4. Erectile dysfunction:    - I explained that conditions like diabetes, hypertension, coronary artery disease, peripheral vascular disease, smoking, alcohol consumption, age, sleep apnea and BPH can diminish the ability to have an erection - I explained the ED may be a risk marker for underlying CVD and he should follow up with PCP for further studies *** - A recent study published in Sex Med 2018 Apr 13 revealed moderate to vigorous aerobic exercise for 40 minutes 4 times per week can decrease erectile problems caused by physical inactivity, obesity, hypertension, metabolic syndrome and/or cardiovascular diseases *** - We discussed trying a *** different PDE5 inhibitor, intra-urethral suppositories, intracavernous vasoactive drug injection therapy, vacuum erection devices and penile prosthesis implantation   No follow-ups on file.  These notes generated with voice recognition software. I apologize for typographical errors.  Cloretta Ned  [facility] [address]  [address] [place], [place] [address] [phone]

## 2023-09-17 ENCOUNTER — Ambulatory Visit: Admitting: Urology

## 2023-09-17 DIAGNOSIS — N401 Enlarged prostate with lower urinary tract symptoms: Secondary | ICD-10-CM

## 2023-09-17 DIAGNOSIS — E291 Testicular hypofunction: Secondary | ICD-10-CM

## 2023-09-17 DIAGNOSIS — N529 Male erectile dysfunction, unspecified: Secondary | ICD-10-CM

## 2023-09-17 DIAGNOSIS — N201 Calculus of ureter: Secondary | ICD-10-CM

## 2023-09-22 ENCOUNTER — Other Ambulatory Visit: Payer: Self-pay | Admitting: Internal Medicine

## 2023-09-22 DIAGNOSIS — M25561 Pain in right knee: Secondary | ICD-10-CM

## 2023-09-22 DIAGNOSIS — G8929 Other chronic pain: Secondary | ICD-10-CM

## 2023-09-24 ENCOUNTER — Encounter: Payer: Self-pay | Admitting: Urology

## 2024-03-12 ENCOUNTER — Ambulatory Visit: Admitting: Internal Medicine

## 2024-03-12 ENCOUNTER — Encounter: Payer: Self-pay | Admitting: Internal Medicine

## 2024-03-12 VITALS — BP 132/86 | HR 78 | Ht 73.0 in | Wt 242.6 lb

## 2024-03-12 DIAGNOSIS — E782 Mixed hyperlipidemia: Secondary | ICD-10-CM | POA: Diagnosis not present

## 2024-03-12 DIAGNOSIS — M79674 Pain in right toe(s): Secondary | ICD-10-CM

## 2024-03-12 DIAGNOSIS — R030 Elevated blood-pressure reading, without diagnosis of hypertension: Secondary | ICD-10-CM | POA: Diagnosis not present

## 2024-03-12 DIAGNOSIS — K219 Gastro-esophageal reflux disease without esophagitis: Secondary | ICD-10-CM | POA: Diagnosis not present

## 2024-03-12 DIAGNOSIS — R7302 Impaired glucose tolerance (oral): Secondary | ICD-10-CM

## 2024-03-12 NOTE — Progress Notes (Signed)
 Established Patient Office Visit  Subjective:  Patient ID: Jeffery Collins, male    DOB: July 28, 1971  Age: 53 y.o. MRN: [id number]  Chief Complaint  Patient presents with   Acute Visit    Possible gout on toe    Patient comes in for his follow-up today.  He has not been in for a while.  However reports that he was away on training and developed pain, redness, swelling of his right big toe.  He went to a local urgent care center where he was told this could be gout.  Since then he was controlling his diet and avoiding red meat and alcohol.  However he developed another episode 2 days ago with pain and tenderness of his right big toe, which is slowly subsiding today. Will check his labs today including uric acid levels. Patient also suffers from kidney stones and underwent lithotripsy in October 2024.  Not sure if it was a uric acid stone.   Currently taking all his prescribed medications and otherwise feels well.    No other concerns at this time.   Past Medical History:  Diagnosis Date   GAD (generalized anxiety disorder)    Hypogonadism in male    Impaired glucose tolerance    Kidney stones    Mixed hyperlipidemia    Pre-hypertension     Past Surgical History:  Procedure Laterality Date   COLONOSCOPY  2005   COLONOSCOPY WITH PROPOFOL  N/A 09/19/2022   Procedure: COLONOSCOPY WITH PROPOFOL ;  Surgeon: Selena Daily, MD;  Location: [facility] ENDOSCOPY;  Service: Gastroenterology;  Laterality: N/A;   EXTRACORPOREAL SHOCK WAVE LITHOTRIPSY Left 08/28/2023   Procedure: EXTRACORPOREAL SHOCK WAVE LITHOTRIPSY (ESWL);  Surgeon: Geraline Knapp, MD;  Location: [facility] ORS;  Service: Urology;  Laterality: Left;   LITHOTRIPSY  02/2014    Social History   Socioeconomic History   Marital status: Married    Spouse name: Not on file   Number of children: Not on file   Years of education: Not on file   Highest education level: Not on file  Occupational History   Not on file  Tobacco  Use   Smoking status: Former    Types: Cigarettes   Smokeless tobacco: Never   Tobacco comments:    stopped 2000  Vaping Use   Vaping status: Never Used  Substance and Sexual Activity   Alcohol use: Yes    Alcohol/week: 2.0 standard drinks of alcohol    Types: 2 Standard drinks or equivalent per week   Drug use: No   Sexual activity: Yes  Other Topics Concern   Not on file  Social History Narrative   Not on file   Social Drivers of Health   Financial Resource Strain: Not on file  Food Insecurity: Not on file  Transportation Needs: Not on file  Physical Activity: Not on file  Stress: Not on file  Social Connections: Not on file  Intimate Partner Violence: Not on file    Family History  Problem Relation Age of Onset   Diabetes Mother    Lung cancer Father     No Known Allergies  Outpatient Medications Prior to Visit  Medication Sig   celecoxib  (CELEBREX ) 200 MG capsule TAKE 1 CAPSULE BY MOUTH EVERY DAY   clobetasol ointment (TEMOVATE) 0.05 % Apply 1 Application topically 2 (two) times daily.   DULoxetine (CYMBALTA) 60 MG capsule TAKE 1 CAPSULE BY MOUTH DAILY   fenofibrate (TRICOR) 145 MG tablet TAKE 1 TABLET BY MOUTH EVERY  DAY   Fluocinolone Acetonide Scalp 0.01 % OIL fluocinolone 0.01% topical oil Start Date: 07/27/20 Status: Ordered Repeat number: 1   Testosterone  20.25 MG/ACT (1.62%) GEL 1 pump applied to each shoulder daily   tamsulosin  (FLOMAX ) 0.4 MG CAPS capsule Take 1 capsule (0.4 mg total) by mouth daily after breakfast. (Patient not taking: Reported on 03/12/2024)   [DISCONTINUED] oxyCODONE  (OXY IR/ROXICODONE ) 5 MG immediate release tablet Take 1-2 tablets (5-10 mg total) by mouth every 6 (six) hours as needed for severe pain. (Patient not taking: Reported on 03/12/2024)   No facility-administered medications prior to visit.    Review of Systems  Constitutional: Negative.  Negative for fever.  HENT: Negative.  Negative for sore throat.   Eyes: Negative.    Respiratory: Negative.  Negative for cough and shortness of breath.   Cardiovascular: Negative.  Negative for chest pain, palpitations and leg swelling.  Gastrointestinal: Negative.  Negative for abdominal pain, constipation, diarrhea, heartburn, nausea and vomiting.  Genitourinary: Negative.  Negative for dysuria and flank pain.  Musculoskeletal:  Positive for joint pain. Negative for myalgias.  Skin: Negative.   Neurological: Negative.  Negative for dizziness and headaches.  Endo/Heme/Allergies: Negative.   Psychiatric/Behavioral: Negative.  Negative for depression and suicidal ideas. The patient is not nervous/anxious.        Objective:   BP 132/86   Pulse 78   Ht 6\' 1"  (1.854 m)   Wt 242 lb 9.6 oz (110 kg)   SpO2 96%   BMI 32.01 kg/m   Vitals:   03/12/24 1023  BP: 132/86  Pulse: 78  Height: 6\' 1"  (1.854 m)  Weight: 242 lb 9.6 oz (110 kg)  SpO2: 96%  BMI (Calculated): 32.01    Physical Exam Vitals and nursing note reviewed.  Constitutional:      Appearance: Normal appearance.  HENT:     Head: Normocephalic and atraumatic.     Nose: Nose normal.     Mouth/Throat:     Mouth: Mucous membranes are moist.     Pharynx: Oropharynx is clear.  Eyes:     Conjunctiva/sclera: Conjunctivae normal.     Pupils: Pupils are equal, round, and reactive to light.  Cardiovascular:     Rate and Rhythm: Normal rate and regular rhythm.     Pulses: Normal pulses.     Heart sounds: Normal heart sounds.  Pulmonary:     Effort: Pulmonary effort is normal.     Breath sounds: Normal breath sounds.  Abdominal:     General: Bowel sounds are normal.     Palpations: Abdomen is soft.  Musculoskeletal:        General: Normal range of motion.     Cervical back: Normal range of motion.  Skin:    General: Skin is warm and dry.  Neurological:     General: No focal deficit present.     Mental Status: He is alert and oriented to person, place, and time.  Psychiatric:        Mood and  Affect: Mood normal.        Behavior: Behavior normal.        Judgment: Judgment normal.      No results found for any visits on 03/12/24.  No results found for this or any previous visit (from the past 2160 hours).    Assessment & Plan:  Check labs today.  Return in 1 week to discuss results and start medications. Problem List Items Addressed This Visit  Gastro-esophageal reflux disease without esophagitis   Relevant Orders   CBC with Diff   Pre-hypertension   Impaired glucose tolerance   Relevant Orders   Hemoglobin A1c   Mixed hyperlipidemia   Relevant Orders   CMP14+EGFR   Lipid Panel w/o Chol/HDL Ratio   Other Visit Diagnoses       Pain of right great toe    -  Primary   Relevant Orders   Uric acid   Arthritis Panel       Return in about 1 week (around 03/19/2024).   Total time spent: 30 minutes  Aisha Hove, MD  03/12/2024   This document may have been prepared by [facility] Voice Recognition software and as such may include unintentional dictation errors.

## 2024-03-16 ENCOUNTER — Other Ambulatory Visit: Payer: Self-pay | Admitting: Internal Medicine

## 2024-03-16 DIAGNOSIS — M109 Gout, unspecified: Secondary | ICD-10-CM

## 2024-03-16 LAB — CMP14+EGFR
ALT: 25 IU/L (ref 0–44)
AST: 18 IU/L (ref 0–40)
Albumin: 4.7 g/dL (ref 3.8–4.9)
Alkaline Phosphatase: 95 IU/L (ref 44–121)
BUN/Creatinine Ratio: 17 (ref 9–20)
BUN: 16 mg/dL (ref 6–24)
Bilirubin Total: 0.4 mg/dL (ref 0.0–1.2)
CO2: 22 mmol/L (ref 20–29)
Calcium: 10 mg/dL (ref 8.7–10.2)
Chloride: 102 mmol/L (ref 96–106)
Creatinine, Ser: 0.95 mg/dL (ref 0.76–1.27)
Globulin, Total: 2.5 g/dL (ref 1.5–4.5)
Glucose: 101 mg/dL — ABNORMAL HIGH (ref 70–99)
Potassium: 4.6 mmol/L (ref 3.5–5.2)
Sodium: 140 mmol/L (ref 134–144)
Total Protein: 7.2 g/dL (ref 6.0–8.5)
eGFR: 97 mL/min/{1.73_m2} (ref >59)

## 2024-03-16 LAB — CBC WITH DIFFERENTIAL/PLATELET
Basophils Absolute: 0 10*3/uL (ref 0.0–0.2)
Basos: 0 %
EOS (ABSOLUTE): 0.1 10*3/uL (ref 0.0–0.4)
Eos: 1 %
Hematocrit: 41.4 % (ref 37.5–51.0)
Hemoglobin: 13.7 g/dL (ref 13.0–17.7)
Immature Grans (Abs): 0 10*3/uL (ref 0.0–0.1)
Immature Granulocytes: 1 %
Lymphocytes Absolute: 1.7 10*3/uL (ref 0.7–3.1)
Lymphs: 28 %
MCH: 27.8 pg (ref 26.6–33.0)
MCHC: 33.1 g/dL (ref 31.5–35.7)
MCV: 84 fL (ref 79–97)
Monocytes Absolute: 0.5 10*3/uL (ref 0.1–0.9)
Monocytes: 8 %
Neutrophils Absolute: 3.7 10*3/uL (ref 1.4–7.0)
Neutrophils: 62 %
Platelets: 232 10*3/uL (ref 150–450)
RBC: 4.92 x10E6/uL (ref 4.14–5.80)
RDW: 12.7 % (ref 11.6–15.4)
WBC: 6 10*3/uL (ref 3.4–10.8)

## 2024-03-16 LAB — LIPID PANEL W/O CHOL/HDL RATIO
Cholesterol, Total: 216 mg/dL — ABNORMAL HIGH (ref 100–199)
HDL: 44 mg/dL (ref >39)
LDL Chol Calc (NIH): 119 mg/dL — ABNORMAL HIGH (ref 0–99)
Triglycerides: 302 mg/dL — ABNORMAL HIGH (ref 0–149)
VLDL Cholesterol Cal: 53 mg/dL — ABNORMAL HIGH (ref 5–40)

## 2024-03-16 LAB — ARTHRITIS PANEL
Anti Nuclear Antibody (ANA): NEGATIVE
Rheumatoid fact SerPl-aCnc: <10 [IU]/mL (ref <14.0)
Sed Rate: 11 mm/h (ref 0–30)
Uric Acid: 8.1 mg/dL (ref 3.8–8.4)

## 2024-03-16 LAB — HEMOGLOBIN A1C
Est. average glucose Bld gHb Est-mCnc: 117 mg/dL
Hgb A1c MFr Bld: 5.7 % — ABNORMAL HIGH (ref 4.8–5.6)

## 2024-03-16 MED ORDER — ALLOPURINOL 300 MG PO TABS
300.0000 mg | ORAL_TABLET | Freq: Every day | ORAL | 2 refills | Status: AC
Start: 2024-03-16 — End: 2025-03-16

## 2024-03-18 ENCOUNTER — Other Ambulatory Visit: Payer: Self-pay | Admitting: Internal Medicine

## 2024-03-18 ENCOUNTER — Ambulatory Visit: Admitting: Internal Medicine

## 2024-03-22 ENCOUNTER — Other Ambulatory Visit: Payer: Self-pay | Admitting: Internal Medicine

## 2024-03-22 DIAGNOSIS — E782 Mixed hyperlipidemia: Secondary | ICD-10-CM

## 2024-03-22 MED ORDER — ICOSAPENT ETHYL 1 G PO CAPS
2.0000 g | ORAL_CAPSULE | Freq: Two times a day (BID) | ORAL | 6 refills | Status: AC
Start: 2024-03-22 — End: ?

## 2024-05-04 ENCOUNTER — Other Ambulatory Visit: Payer: Self-pay | Admitting: Urology

## 2024-05-07 ENCOUNTER — Encounter: Payer: Self-pay | Admitting: Internal Medicine

## 2024-05-07 ENCOUNTER — Ambulatory Visit (INDEPENDENT_AMBULATORY_CARE_PROVIDER_SITE_OTHER): Admitting: Internal Medicine

## 2024-05-07 VITALS — BP 132/76 | HR 84 | Ht 73.0 in | Wt 238.8 lb

## 2024-05-07 DIAGNOSIS — R7302 Impaired glucose tolerance (oral): Secondary | ICD-10-CM

## 2024-05-07 DIAGNOSIS — R030 Elevated blood-pressure reading, without diagnosis of hypertension: Secondary | ICD-10-CM

## 2024-05-07 DIAGNOSIS — E782 Mixed hyperlipidemia: Secondary | ICD-10-CM | POA: Diagnosis not present

## 2024-05-07 DIAGNOSIS — M109 Gout, unspecified: Secondary | ICD-10-CM

## 2024-05-07 DIAGNOSIS — K219 Gastro-esophageal reflux disease without esophagitis: Secondary | ICD-10-CM | POA: Diagnosis not present

## 2024-05-07 DIAGNOSIS — H9193 Unspecified hearing loss, bilateral: Secondary | ICD-10-CM

## 2024-05-07 NOTE — Progress Notes (Signed)
 Established Patient Office Visit  Subjective:  Patient ID: Jeffery Collins, male    DOB: 1971/05/15  Age: 53 y.o. MRN: [id number]  Chief Complaint  Patient presents with   Follow-up    Discuss audiology referral    Patient comes in for his follow-up and also concerned about his hearing loss.  He reports that he has a history of hearing deficit for many years, but will need an audiology referral who is within the [facility] network.  Patient will give us  the name and address of the audiologist.  He admits that his gout symptoms have improved but he is not taking the medications regularly including his fenofibrate.  Patient promises that he will come up with a systemic to make sure that he takes his medications daily.  He will return in 6 weeks and we will check fasting labs at that time.    No other concerns at this time.   Past Medical History:  Diagnosis Date   GAD (generalized anxiety disorder)    Hypogonadism in male    Impaired glucose tolerance    Kidney stones    Mixed hyperlipidemia    Pre-hypertension     Past Surgical History:  Procedure Laterality Date   COLONOSCOPY  2005   COLONOSCOPY WITH PROPOFOL  N/A 09/19/2022   Procedure: COLONOSCOPY WITH PROPOFOL ;  Surgeon: Selena Daily, MD;  Location: [facility];  Service: Gastroenterology;  Laterality: N/A;   EXTRACORPOREAL SHOCK WAVE LITHOTRIPSY Left 08/28/2023   Procedure: EXTRACORPOREAL SHOCK WAVE LITHOTRIPSY (ESWL);  Surgeon: Geraline Knapp, MD;  Location: [facility] ORS;  Service: Urology;  Laterality: Left;   LITHOTRIPSY  02/2014    Social History   Socioeconomic History   Marital status: Married    Spouse name: Not on file   Number of children: Not on file   Years of education: Not on file   Highest education level: Not on file  Occupational History   Not on file  Tobacco Use   Smoking status: Former    Types: Cigarettes   Smokeless tobacco: Never   Tobacco comments:    stopped 2000  Vaping Use    Vaping status: Never Used  Substance and Sexual Activity   Alcohol use: Yes    Alcohol/week: 2.0 standard drinks of alcohol    Types: 2 Standard drinks or equivalent per week   Drug use: No   Sexual activity: Yes  Other Topics Concern   Not on file  Social History Narrative   Not on file   Social Drivers of Health   Financial Resource Strain: Not on file  Food Insecurity: Not on file  Transportation Needs: Not on file  Physical Activity: Not on file  Stress: Not on file  Social Connections: Not on file  Intimate Partner Violence: Not on file    Family History  Problem Relation Age of Onset   Diabetes Mother    Lung cancer Father     No Known Allergies  Outpatient Medications Prior to Visit  Medication Sig   allopurinol  (ZYLOPRIM ) 300 MG tablet Take 1 tablet (300 mg total) by mouth daily.   celecoxib  (CELEBREX ) 200 MG capsule TAKE 1 CAPSULE BY MOUTH EVERY DAY   clobetasol ointment (TEMOVATE) 0.05 % Apply 1 Application topically 2 (two) times daily.   DULoxetine (CYMBALTA) 60 MG capsule TAKE 1 CAPSULE BY MOUTH DAILY   fenofibrate (TRICOR) 145 MG tablet TAKE 1 TABLET BY MOUTH EVERY DAY   Fluocinolone Acetonide Scalp 0.01 % OIL fluocinolone 0.01%  topical oil Start Date: 07/27/20 Status: Ordered Repeat number: 1   icosapent  Ethyl (VASCEPA ) 1 g capsule Take 2 capsules (2 g total) by mouth 2 (two) times daily.   Testosterone  20.25 MG/ACT (1.62%) GEL 1 pump applied to each shoulder daily   tamsulosin  (FLOMAX ) 0.4 MG CAPS capsule Take 1 capsule (0.4 mg total) by mouth daily after breakfast. (Patient not taking: Reported on 05/07/2024)   No facility-administered medications prior to visit.    Review of Systems  Constitutional: Negative.  Negative for chills, fever, malaise/fatigue and weight loss.  HENT:  Positive for hearing loss. Negative for sore throat.   Eyes: Negative.   Respiratory: Negative.  Negative for cough and shortness of breath.   Cardiovascular: Negative.   Negative for chest pain, palpitations and leg swelling.  Gastrointestinal: Negative.  Negative for abdominal pain, constipation, diarrhea, heartburn, nausea and vomiting.  Genitourinary: Negative.  Negative for dysuria and flank pain.  Musculoskeletal: Negative.  Negative for joint pain and myalgias.  Skin: Negative.   Neurological: Negative.  Negative for dizziness, tingling, tremors and headaches.  Endo/Heme/Allergies: Negative.   Psychiatric/Behavioral: Negative.  Negative for depression and suicidal ideas. The patient is not nervous/anxious.        Objective:   BP 132/76   Pulse 84   Ht 6' 1 (1.854 m)   Wt 238 lb 12.8 oz (108.3 kg)   SpO2 98%   BMI 31.51 kg/m   Vitals:   05/07/24 0942  BP: 132/76  Pulse: 84  Height: 6' 1 (1.854 m)  Weight: 238 lb 12.8 oz (108.3 kg)  SpO2: 98%  BMI (Calculated): 31.51    Physical Exam Vitals and nursing note reviewed.  Constitutional:      Appearance: Normal appearance.  HENT:     Head: Normocephalic and atraumatic.     Nose: Nose normal.     Mouth/Throat:     Mouth: Mucous membranes are moist.     Pharynx: Oropharynx is clear.   Eyes:     Conjunctiva/sclera: Conjunctivae normal.     Pupils: Pupils are equal, round, and reactive to light.    Cardiovascular:     Rate and Rhythm: Normal rate and regular rhythm.     Pulses: Normal pulses.     Heart sounds: Normal heart sounds.  Pulmonary:     Effort: Pulmonary effort is normal.     Breath sounds: Normal breath sounds.  Abdominal:     General: Bowel sounds are normal.     Palpations: Abdomen is soft.   Musculoskeletal:        General: Normal range of motion.     Cervical back: Normal range of motion.   Skin:    General: Skin is warm and dry.   Neurological:     General: No focal deficit present.     Mental Status: He is alert and oriented to person, place, and time.   Psychiatric:        Mood and Affect: Mood normal.        Behavior: Behavior normal.         Judgment: Judgment normal.      No results found for any visits on 05/07/24.  Recent Results (from the past 2160 hours)  CMP14+EGFR     Status: Abnormal   Collection Time: 03/12/24 10:59 AM  Result Value Ref Range   Glucose 101 (H) 70 - 99 mg/dL   BUN 16 6 - 24 mg/dL   Creatinine, Ser 1.61 0.76 - 1.27 mg/dL  eGFR 97 >59 mL/min/1.73   BUN/Creatinine Ratio 17 9 - 20   Sodium 140 134 - 144 mmol/L   Potassium 4.6 3.5 - 5.2 mmol/L   Chloride 102 96 - 106 mmol/L   CO2 22 20 - 29 mmol/L   Calcium 10.0 8.7 - 10.2 mg/dL   Total Protein 7.2 6.0 - 8.5 g/dL   Albumin 4.7 3.8 - 4.9 g/dL   Globulin, Total 2.5 1.5 - 4.5 g/dL   Bilirubin Total 0.4 0.0 - 1.2 mg/dL   Alkaline Phosphatase 95 44 - 121 IU/L   AST 18 0 - 40 IU/L   ALT 25 0 - 44 IU/L  Lipid Panel w/o Chol/HDL Ratio     Status: Abnormal   Collection Time: 03/12/24 10:59 AM  Result Value Ref Range   Cholesterol, Total 216 (H) 100 - 199 mg/dL   Triglycerides 409 (H) 0 - 149 mg/dL   HDL 44 >81 mg/dL   VLDL Cholesterol Cal 53 (H) 5 - 40 mg/dL   LDL Chol Calc (NIH) 191 (H) 0 - 99 mg/dL  CBC with Diff     Status: None   Collection Time: 03/12/24 10:59 AM  Result Value Ref Range   WBC 6.0 3.4 - 10.8 x10E3/uL   RBC 4.92 4.14 - 5.80 x10E6/uL   Hemoglobin 13.7 13.0 - 17.7 g/dL   Hematocrit 47.8 29.5 - 51.0 %   MCV 84 79 - 97 fL   MCH 27.8 26.6 - 33.0 pg   MCHC 33.1 31.5 - 35.7 g/dL   RDW 62.1 30.8 - 65.7 %   Platelets 232 150 - 450 x10E3/uL   Neutrophils 62 Not Estab. %   Lymphs 28 Not Estab. %   Monocytes 8 Not Estab. %   Eos 1 Not Estab. %   Basos 0 Not Estab. %   Neutrophils Absolute 3.7 1.4 - 7.0 x10E3/uL   Lymphocytes Absolute 1.7 0.7 - 3.1 x10E3/uL   Monocytes Absolute 0.5 0.1 - 0.9 x10E3/uL   EOS (ABSOLUTE) 0.1 0.0 - 0.4 x10E3/uL   Basophils Absolute 0.0 0.0 - 0.2 x10E3/uL   Immature Granulocytes 1 Not Estab. %   Immature Grans (Abs) 0.0 0.0 - 0.1 x10E3/uL  Arthritis Panel     Status: None   Collection Time:  03/12/24 10:59 AM  Result Value Ref Range   Uric Acid 8.1 3.8 - 8.4 mg/dL    Comment:            Therapeutic target for gout patients: <6.0   Anti Nuclear Antibody (ANA) Negative Negative   Rheumatoid fact SerPl-aCnc <10.0 <14.0 IU/mL   Sed Rate 11 0 - 30 mm/hr  Hemoglobin A1c     Status: Abnormal   Collection Time: 03/12/24 10:59 AM  Result Value Ref Range   Hgb A1c MFr Bld 5.7 (H) 4.8 - 5.6 %    Comment:          Prediabetes: 5.7 - 6.4          Diabetes: >6.4          Glycemic control for adults with diabetes: <7.0    Est. average glucose Bld gHb Est-mCnc 117 mg/dL      Assessment & Plan:  Audiology referral to be sent.  Advised to take his medications regularly.  Fasting labs at next visit. Problem List Items Addressed This Visit     Gastro-esophageal reflux disease without esophagitis   Pre-hypertension   Impaired glucose tolerance   Mixed hyperlipidemia   Other Visit Diagnoses  Hearing deficit, bilateral    -  Primary   Relevant Orders   Ambulatory referral to Audiology     Gouty arthritis of right great toe           Return in about 6 weeks (around 06/18/2024).   Total time spent: 25 minutes  Aisha Hove, MD  05/07/2024   This document may have been prepared by [facility] Voice Recognition software and as such may include unintentional dictation errors.

## 2024-06-01 ENCOUNTER — Other Ambulatory Visit: Payer: Self-pay | Admitting: Internal Medicine

## 2024-06-01 DIAGNOSIS — M25561 Pain in right knee: Secondary | ICD-10-CM

## 2024-06-01 DIAGNOSIS — G8929 Other chronic pain: Secondary | ICD-10-CM

## 2024-06-14 ENCOUNTER — Other Ambulatory Visit: Payer: Self-pay | Admitting: Internal Medicine

## 2024-06-18 ENCOUNTER — Ambulatory Visit: Admitting: Internal Medicine

## 2024-06-22 ENCOUNTER — Ambulatory Visit (INDEPENDENT_AMBULATORY_CARE_PROVIDER_SITE_OTHER): Admitting: Family

## 2024-06-22 ENCOUNTER — Encounter: Payer: Self-pay | Admitting: Family

## 2024-06-22 VITALS — BP 150/90 | HR 83 | Ht 73.0 in | Wt 239.4 lb

## 2024-06-22 DIAGNOSIS — Z029 Encounter for administrative examinations, unspecified: Secondary | ICD-10-CM

## 2024-06-22 DIAGNOSIS — F325 Major depressive disorder, single episode, in full remission: Secondary | ICD-10-CM

## 2024-06-25 ENCOUNTER — Telehealth: Payer: Self-pay

## 2024-06-25 NOTE — Telephone Encounter (Signed)
 Pt called checking on status of clearance paperwork from appt with Alan on 8/5- Alan has the paperwork and plans to complete but needs an MD signature. He is Neelam's pt but he needs the paperwork completed before Neelam gets back from vacation. Alan asked if you could sign the paperwork, she would complete it? Please advise

## 2024-06-26 ENCOUNTER — Encounter: Payer: Self-pay | Admitting: Family

## 2024-06-26 NOTE — Assessment & Plan Note (Signed)
 Will complete  his paperwork and letter and get these returned to his email.

## 2024-06-26 NOTE — Progress Notes (Signed)
 Established Patient Office Visit  Subjective:  Patient ID: Jeffery Collins, male    DOB: [birth year]  Age: 53 y.o. 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 He had a bout of depression approximately eight years ago. Since that time, he has been well controlled and well managed with his current medication. He has not needed additional treatment from a behavioral health specialist either for counseling or for medication management.  As a result, he asks that we request they remove his depression profile, so that he does not have to have this paperwork completed every single time he needs to go.  No other concerns of this time.    No other concerns at this time.   Past Medical History:  Diagnosis Date   GAD (generalized anxiety disorder)    Hypogonadism in male    Impaired glucose tolerance    Kidney stones    Mixed hyperlipidemia    Pre-hypertension     Past Surgical History:  Procedure Laterality Date   COLONOSCOPY  2005   COLONOSCOPY WITH PROPOFOL  N/A 09/19/2022   Procedure: COLONOSCOPY WITH PROPOFOL ;  Surgeon: Unk Corinn Skiff, MD;  Location: [facility];  Service: Gastroenterology;  Laterality: N/A;   EXTRACORPOREAL SHOCK WAVE LITHOTRIPSY Left 08/28/2023   Procedure: EXTRACORPOREAL SHOCK WAVE LITHOTRIPSY (ESWL);  Surgeon: Twylla Glendia BROCKS, MD;  Location: [facility] ORS;  Service: Urology;  Laterality: Left;   LITHOTRIPSY  02/2014    Social History   Socioeconomic History   Marital status: Married    Spouse name: Not on file   Number of children: Not on file   Years of education: Not on file   Highest education level: Not on file  Occupational History   Not on file  Tobacco Use   Smoking status: Former    Types: Cigarettes   Smokeless tobacco: Never   Tobacco comments:    stopped 2000  Vaping Use   Vaping status: Never Used   Substance and Sexual Activity   Alcohol use: Yes    Alcohol/week: 2.0 standard drinks of alcohol    Types: 2 Standard drinks or equivalent per week   Drug use: No   Sexual activity: Yes  Other Topics Concern   Not on file  Social History Narrative   Not on file   Social Drivers of Health   Financial Resource Strain: Not on file  Food Insecurity: Not on file  Transportation Needs: Not on file  Physical Activity: Not on file  Stress: Not on file  Social Connections: Not on file  Intimate Partner Violence: Not on file    Family History  Problem Relation Age of Onset   Diabetes Mother    Lung cancer Father     No Known Allergies  Review of Systems  All other systems reviewed and are negative.      Objective:   BP (!) 150/90   Pulse 83   Ht 6' 1 (1.854 m)   Wt 239 lb 6.4 oz (108.6 kg)   SpO2 97%   BMI 31.59 kg/m   Vitals:   06/22/24 1324  BP: (!) 150/90  Pulse: 83  Height: 6' 1 (1.854 m)  Weight: 239 lb 6.4 oz (108.6 kg)  SpO2: 97%  BMI (Calculated): 31.59    Physical Exam Vitals and nursing note reviewed.  Constitutional:      Appearance:  Normal appearance. He is normal weight.  Eyes:     Pupils: Pupils are equal, round, and reactive to light.  Cardiovascular:     Rate and Rhythm: Normal rate and regular rhythm.     Pulses: Normal pulses.     Heart sounds: Normal heart sounds.  Pulmonary:     Effort: Pulmonary effort is normal.     Breath sounds: Normal breath sounds.  Neurological:     General: No focal deficit present.     Mental Status: He is alert and oriented to person, place, and time. Mental status is at baseline.  Psychiatric:        Mood and Affect: Mood normal.        Behavior: Behavior normal.        Thought Content: Thought content normal.        Judgment: Judgment normal.      No results found for any visits on 06/22/24.  No results found for this or any previous visit (from the past 2160 hours).     Assessment &  Plan MDD (major depressive disorder), single episode, in full remission (HCC) Encounter for administrative examinations, unspecified Will complete  his paperwork and letter and get these returned to his email.     No follow-ups on file.   Total time spent: 20 minutes  ALAN CHRISTELLA ARRANT, FNP  06/22/2024   This document may have been prepared by [facility] Voice Recognition software and as such may include unintentional dictation errors.

## 2024-08-09 ENCOUNTER — Other Ambulatory Visit: Payer: Self-pay | Admitting: *Deleted

## 2024-08-09 DIAGNOSIS — E291 Testicular hypofunction: Secondary | ICD-10-CM

## 2024-08-11 ENCOUNTER — Other Ambulatory Visit

## 2024-08-11 DIAGNOSIS — E291 Testicular hypofunction: Secondary | ICD-10-CM

## 2024-08-12 LAB — TESTOSTERONE: Testosterone: 166 ng/dL — ABNORMAL LOW (ref 264–916)

## 2024-08-20 ENCOUNTER — Ambulatory Visit (INDEPENDENT_AMBULATORY_CARE_PROVIDER_SITE_OTHER): Admitting: Internal Medicine

## 2024-08-20 ENCOUNTER — Ambulatory Visit
Admission: RE | Admit: 2024-08-20 | Discharge: 2024-08-20 | Disposition: A | Discharge status: Home / Self Care | Attending: Internal Medicine | Admitting: Internal Medicine

## 2024-08-20 ENCOUNTER — Encounter: Payer: Self-pay | Admitting: Internal Medicine

## 2024-08-20 ENCOUNTER — Ambulatory Visit
Admission: RE | Admit: 2024-08-20 | Discharge: 2024-08-20 | Disposition: A | Discharge status: Home / Self Care | Source: Ambulatory Visit | Attending: Internal Medicine | Admitting: Internal Medicine

## 2024-08-20 ENCOUNTER — Ambulatory Visit: Payer: Self-pay | Admitting: Internal Medicine

## 2024-08-20 VITALS — BP 132/86 | HR 77 | Ht 73.0 in | Wt 232.0 lb

## 2024-08-20 DIAGNOSIS — Z0130 Encounter for examination of blood pressure without abnormal findings: Secondary | ICD-10-CM

## 2024-08-20 DIAGNOSIS — M109 Gout, unspecified: Secondary | ICD-10-CM | POA: Diagnosis not present

## 2024-08-20 DIAGNOSIS — R7302 Impaired glucose tolerance (oral): Secondary | ICD-10-CM

## 2024-08-20 DIAGNOSIS — M79644 Pain in right finger(s): Secondary | ICD-10-CM

## 2024-08-20 DIAGNOSIS — E782 Mixed hyperlipidemia: Secondary | ICD-10-CM

## 2024-08-20 DIAGNOSIS — K219 Gastro-esophageal reflux disease without esophagitis: Secondary | ICD-10-CM

## 2024-08-20 MED ORDER — PREDNISONE 20 MG PO TABS
40.0000 mg | ORAL_TABLET | Freq: Every day | ORAL | 0 refills | Status: DC
Start: 2024-08-20 — End: 2024-08-27

## 2024-08-20 NOTE — Progress Notes (Signed)
 Established Patient Office Visit  Subjective:  Patient ID: Jeffery Collins, male    DOB: 1971/11/13  Age: 53 y.o. MRN: [id number]  Chief Complaint  Patient presents with   Hand Pain    Right thumb pain x 4 days    Patient comes in with c/o right thumb, pain and swelling ,started spontaneously 3 days ago. No history of trauma or injury, just woke up with it. Patient has been taking his Allopurinol  regularly.  o/e his right thumb is swollen, has stiffness, no cellulitis- Will check xray, start prednisone  burst. Needs fasting labs.    No other concerns at this time.   Past Medical History:  Diagnosis Date   GAD (generalized anxiety disorder)    Hypogonadism in male    Impaired glucose tolerance    Kidney stones    Mixed hyperlipidemia    Pre-hypertension     Past Surgical History:  Procedure Laterality Date   COLONOSCOPY  2005   COLONOSCOPY WITH PROPOFOL  N/A 09/19/2022   Procedure: COLONOSCOPY WITH PROPOFOL ;  Surgeon: Unk Corinn Skiff, MD;  Location: [facility];  Service: Gastroenterology;  Laterality: N/A;   EXTRACORPOREAL SHOCK WAVE LITHOTRIPSY Left 08/28/2023   Procedure: EXTRACORPOREAL SHOCK WAVE LITHOTRIPSY (ESWL);  Surgeon: Twylla Glendia BROCKS, MD;  Location: [facility] OR[facility];  Service: Urology;  Laterality: Left;   LITHOTRIPSY  02/2014    Social History   Socioeconomic History   Marital status: Married    Spouse name: Not on file   Number of children: Not on file   Years of education: Not on file   Highest education level: Not on file  Occupational History   Not on file  Tobacco Use   Smoking status: Former    Types: Cigarettes   Smokeless tobacco: Never   Tobacco comments:    stopped 2000  Vaping Use   Vaping status: Never Used  Substance and Sexual Activity   Alcohol use: Yes    Alcohol/week: 2.0 standard drinks of alcohol    Types: 2 Standard drinks or equivalent per week   Drug use: No   Sexual activity: Yes  Other Topics Concern   Not on file   Social History Narrative   Not on file   Social Drivers of Health   Financial Resource Strain: Not on file  Food Insecurity: Not on file  Transportation Needs: Not on file  Physical Activity: Not on file  Stress: Not on file  Social Connections: Not on file  Intimate Partner Violence: Not on file    Family History  Problem Relation Age of Onset   Diabetes Mother    Lung cancer Father     No Known Allergies  Outpatient Medications Prior to Visit  Medication Sig   allopurinol  (ZYLOPRIM ) 300 MG tablet Take 1 tablet (300 mg total) by mouth daily.   celecoxib  (CELEBREX ) 200 MG capsule TAKE 1 CAPSULE BY MOUTH EVERY DAY   clobetasol ointment (TEMOVATE) 0.05 % Apply 1 Application topically 2 (two) times daily.   DULoxetine (CYMBALTA) 60 MG capsule TAKE 1 CAPSULE BY MOUTH DAILY   fenofibrate (TRICOR) 145 MG tablet TAKE 1 TABLET BY MOUTH EVERY DAY   Fluocinolone Acetonide Scalp 0.01 % OIL fluocinolone 0.01% topical oil Start Date: 07/27/20 Status: Ordered Repeat number: 1   icosapent  Ethyl (VASCEPA ) 1 g capsule Take 2 capsules (2 g total) by mouth 2 (two) times daily.   Testosterone  20.25 MG/ACT (1.62%) GEL 1 pump applied to each shoulder daily   tamsulosin  (FLOMAX ) 0.4 MG  CAPS capsule Take 1 capsule (0.4 mg total) by mouth daily after breakfast. (Patient not taking: Reported on 08/20/2024)   No facility-administered medications prior to visit.    Review of Systems  Constitutional: Negative.  Negative for chills, fever and malaise/fatigue.  HENT: Negative.  Negative for congestion and sore throat.   Eyes: Negative.  Negative for blurred vision and pain.  Respiratory: Negative.  Negative for cough and shortness of breath.   Cardiovascular: Negative.  Negative for chest pain, palpitations and leg swelling.  Gastrointestinal: Negative.  Negative for abdominal pain, blood in stool, constipation, diarrhea, heartburn, melena, nausea and vomiting.  Genitourinary: Negative.  Negative for  dysuria, flank pain, frequency and urgency.  Musculoskeletal:  Positive for joint pain (Right thumb). Negative for myalgias.  Skin: Negative.   Neurological: Negative.  Negative for dizziness, tingling, sensory change, weakness and headaches.  Endo/Heme/Allergies: Negative.   Psychiatric/Behavioral: Negative.  Negative for depression and suicidal ideas. The patient is not nervous/anxious.        Objective:   BP 132/86   Pulse 77   Ht 6' 1 (1.854 m)   Wt 232 lb (105.2 kg)   SpO2 95%   BMI 30.61 kg/m   Vitals:   08/20/24 1401  BP: 132/86  Pulse: 77  Height: 6' 1 (1.854 m)  Weight: 232 lb (105.2 kg)  SpO2: 95%  BMI (Calculated): 30.62    Physical Exam Vitals and nursing note reviewed.  Constitutional:      General: He is not in acute distress.    Appearance: Normal appearance. He is not ill-appearing.  HENT:     Head: Normocephalic and atraumatic.     Nose: Nose normal.     Mouth/Throat:     Mouth: Mucous membranes are moist.     Pharynx: Oropharynx is clear.  Eyes:     Conjunctiva/sclera: Conjunctivae normal.     Pupils: Pupils are equal, round, and reactive to light.  Cardiovascular:     Rate and Rhythm: Normal rate and regular rhythm.     Pulses: Normal pulses.     Heart sounds: Normal heart sounds.  Pulmonary:     Effort: Pulmonary effort is normal.     Breath sounds: Normal breath sounds. No wheezing or rhonchi.  Abdominal:     General: Bowel sounds are normal. There is no distension.     Palpations: Abdomen is soft.     Tenderness: There is no abdominal tenderness.  Musculoskeletal:        General: Normal range of motion.     Cervical back: Normal range of motion and neck supple.     Right lower leg: No edema.     Left lower leg: No edema.  Skin:    General: Skin is warm and dry.     Capillary Refill: Capillary refill takes less than 2 seconds.  Neurological:     General: No focal deficit present.     Mental Status: He is alert and oriented to  person, place, and time.     Sensory: No sensory deficit.     Motor: No weakness.  Psychiatric:        Mood and Affect: Mood normal.        Behavior: Behavior normal.        Judgment: Judgment normal.      No results found for any visits on 08/20/24.  Recent Results (from the past 2160 hours)  Testosterone      Status: Abnormal   Collection Time: 08/11/24  8:10 AM  Result Value Ref Range   Testosterone  166 (L) 264 - 916 ng/dL    Comment: Adult male reference interval is based on a population of healthy nonobese males (BMI <30) between 64 and 65 years old. Travison, et.al. JCEM [phone]. PMID: 71675896.       Assessment & Plan:  Xray right thumb. Prednisone  burst. Fasting labs. Problem List Items Addressed This Visit     Gastro-esophageal reflux disease without esophagitis   Relevant Orders   CBC with Diff   Impaired glucose tolerance   Relevant Orders   Hemoglobin A1c   Mixed hyperlipidemia   Relevant Orders   Lipid Panel w/o Chol/HDL Ratio   CMP14+EGFR   Other Visit Diagnoses       Pain of right thumb    -  Primary   Relevant Medications   predniSONE  (DELTASONE ) 20 MG tablet   Other Relevant Orders   DG Finger Thumb Right (Completed)     Gouty arthritis       Relevant Medications   predniSONE  (DELTASONE ) 20 MG tablet       Return in about 1 week (around 08/27/2024).   Total time spent: 25 minutes  FERNAND FREDY RAMAN, MD  08/20/2024   This document may have been prepared by [facility] Voice Recognition software and as such may include unintentional dictation errors.

## 2024-08-20 NOTE — Progress Notes (Signed)
 Patient notified

## 2024-08-23 NOTE — Progress Notes (Unsigned)
 08/25/2024 9:32 PM   Jeffery Collins 1971/07/03 [id number]  Referring provider: Fernand Fredy RAMAN, MD [address] [place],  [place] [address]  Urological history: 1. Nephrolithiasis - stone composition calcium oxalate and calcium phosphate  - left ESWL (08/2023)  - right ESWL (2015)  - 24 hour metabolic work up - hypercalciuria  2. Hypogonadism - testosterone  level (07/2024) 166 - hematocrit/hemoglobin (02/2024) 13.7/41.4 - testosterone  gel 1.62%, 2 pumps daily   3. BPH with LU TS - PSA pending   No chief complaint on file.  HPI: Jeffery Collins is a 53 y.o. man who presents today for medication management.    Previous records reviewed.  He reports good adherence to Clomid 50 mg, 1/2 tablet daily.  *** Denies new complaints of low libido, erectile dysfunction, fatigue, or mood changes.  No complaints of gynecomastia, visual changes, or thromboembolic symptoms.  Energy level, libido and overall sense of wellbeing being reported as stable/ improved compared to prior visit.    Testosterone  level 166  Hemoglobin/hematocrit (02/2024) 13.7/41.4  Liver enzymes (02/2024) WNL   I PSS ***  He reports sensation of incomplete bladder emptying, urinary frequency, urinary intermittency, urinary urgency, a weak urinary stream, having to strain to void, nocturia x ***, leaking before being able to reach the restroom, leaking with coughing, leaking without awareness, and post void dribbling.     He is wearing *** pads//depends  daily.    Patient denies any modifying or aggravating factors.  Patient denies any recent UTI's, gross hematuria, dysuria or suprapubic/flank pain.  Patient denies any fevers, chills, nausea or vomiting.  ***  He has a family history of PCa, colon cancer, ovarian cancer and/or breast cancer with ***.   He does not have a family history of PCa, colon cancer, ovarian cancer, and/or breast cancer .***     PSA  pending   Serum creatinine (02/2024)  0.95, eGFR 97  Hemoglobin A1c (02/2024) 5.7   SHIM ***  He does not have confidence that he could get and keep an erection, his erections are not firm enough for penetrative intercourse, he has difficulty maintaining his erections,  and he is not finding intercourse satisfactory for him.  ***  Patient still having spontaneous erections.  ***   He denies any pain or curvature with erections.    He is not able to ejaculate, has pain with ejaculation, and has blood in his ejaculate fluid.   ***  Cholesterol (02/2024) 216   PMH: Past Medical History:  Diagnosis Date   GAD (generalized anxiety disorder)    Hypogonadism in male    Impaired glucose tolerance    Kidney stones    Mixed hyperlipidemia    Pre-hypertension     Surgical History: Past Surgical History:  Procedure Laterality Date   COLONOSCOPY  2005   COLONOSCOPY WITH PROPOFOL  N/A 09/19/2022   Procedure: COLONOSCOPY WITH PROPOFOL ;  Surgeon: Unk Corinn Skiff, MD;  Location: [facility];  Service: Gastroenterology;  Laterality: N/A;   EXTRACORPOREAL SHOCK WAVE LITHOTRIPSY Left 08/28/2023   Procedure: EXTRACORPOREAL SHOCK WAVE LITHOTRIPSY (ESWL);  Surgeon: Twylla Glendia BROCKS, MD;  Location: [facility];  Service: Urology;  Laterality: Left;   LITHOTRIPSY  02/2014    Home Medications:  Allergies as of 08/25/2024   No Known Allergies      Medication List        Accurate as of August 23, 2024  9:32 PM. If you have any questions, ask your nurse or doctor.  allopurinol  300 MG tablet Commonly known as: Zyloprim  Take 1 tablet (300 mg total) by mouth daily.   celecoxib  200 MG capsule Commonly known as: CELEBREX  TAKE 1 CAPSULE BY MOUTH EVERY DAY   clobetasol ointment 0.05 % Commonly known as: TEMOVATE Apply 1 Application topically 2 (two) times daily.   DULoxetine 60 MG capsule Commonly known as: CYMBALTA TAKE 1 CAPSULE BY MOUTH DAILY   fenofibrate 145 MG tablet Commonly known as: TRICOR TAKE 1 TABLET  BY MOUTH EVERY DAY   Fluocinolone Acetonide Scalp 0.01 % Oil fluocinolone 0.01% topical oil Start Date: 07/27/20 Status: Ordered Repeat number: 1   icosapent  Ethyl 1 g capsule Commonly known as: Vascepa  Take 2 capsules (2 g total) by mouth 2 (two) times daily.   predniSONE  20 MG tablet Commonly known as: DELTASONE  Take 2 tablets (40 mg total) by mouth daily with breakfast.   tamsulosin  0.4 MG Caps capsule Commonly known as: FLOMAX  Take 1 capsule (0.4 mg total) by mouth daily after breakfast.   Testosterone  20.25 MG/ACT (1.62%) Gel 1 pump applied to each shoulder daily        Allergies: No Known Allergies  Family History: Family History  Problem Relation Age of Onset   Diabetes Mother    Lung cancer Father     Social History:  reports that he has quit smoking. His smoking use included cigarettes. He has never used smokeless tobacco. He reports current alcohol use of about 2.0 standard drinks of alcohol per week. He reports that he does not use drugs.  ROS: Pertinent ROS in HPI  Physical Exam: There were no vitals taken for this visit.  Constitutional:  Well nourished. Alert and oriented, No acute distress. HEENT: [place] AT, moist mucus membranes.  Trachea midline, no masses. Cardiovascular: No clubbing, cyanosis, or edema. Respiratory: Normal respiratory effort, no increased work of breathing. GI: Abdomen is soft, non tender, non distended, no abdominal masses. Liver and spleen not palpable.  No hernias appreciated.  Stool sample for occult testing is not indicated.   GU: No CVA tenderness.  No bladder fullness or masses.  Patient with circumcised/uncircumcised phallus. ***Foreskin easily retracted***  Urethral meatus is patent.  No penile discharge. No penile lesions or rashes. Scrotum without lesions, cysts, rashes and/or edema.  Testicles are located scrotally bilaterally. No masses are appreciated in the testicles. Left and right epididymis are normal. Rectal: Patient  with  normal sphincter tone. Anus and perineum without scarring or rashes. No rectal masses are appreciated. Prostate is approximately *** grams, *** nodules are appreciated. Seminal vesicles are normal. Skin: No rashes, bruises or suspicious lesions. Lymph: No cervical or inguinal adenopathy. Neurologic: Grossly intact, no focal deficits, moving all 4 extremities. Psychiatric: Normal mood and affect.  Laboratory Data: See Epic and HPI I have reviewed the labs.  See HPI.    Pertinent Imaging: N/A  Assessment & Plan:  ***  1. Hypogonadism  -testosterone  levels are subtherapeutic -H & H WNL -continue ***  2. BPH with LUTS -PSA pending  -DRE benign *** -UA benign *** -PVR < 300 cc *** -symptoms - *** -most bothersome symptoms are *** -continue conservative management, avoiding bladder irritants and timed voiding's -Initiate alpha-blocker (***), discussed side effects *** -Initiate 5 alpha reductase inhibitor (***), discussed side effects *** -Continue tamsulosin  0.4 mg daily, alfuzosin 10 mg daily, Rapaflo 8 mg daily, terazosin, doxazosin, Cialis 5 mg daily and finasteride 5 mg daily, dutasteride 0.5 mg daily***:refills given -Cannot tolerate medication or medication failure, schedule cystoscopy ***  3. Erectile dysfunction:    - I explained that conditions like diabetes, hypertension, coronary artery disease, peripheral vascular disease, smoking, alcohol consumption, age, sleep apnea and BPH can diminish the ability to have an erection - I explained the ED may be a risk marker for underlying CVD and he should follow up with PCP for further studies *** - A recent study published in Sex Med 2018 Apr 13 revealed moderate to vigorous aerobic exercise for 40 minutes 4 times per week can decrease erectile problems caused by physical inactivity, obesity, hypertension, metabolic syndrome and/or cardiovascular diseases *** - We discussed trying a *** different PDE5 inhibitor,  intra-urethral suppositories, intracavernous vasoactive drug injection therapy, vacuum erection devices and penile prosthesis implantation         No follow-ups on file.  These notes generated with voice recognition software. I apologize for typographical errors.  CLOTILDA HELON RIGGERS  [facility] [address]  [address] [place], [place] [address] [phone]

## 2024-08-25 ENCOUNTER — Ambulatory Visit (INDEPENDENT_AMBULATORY_CARE_PROVIDER_SITE_OTHER): Admitting: Urology

## 2024-08-25 ENCOUNTER — Encounter: Payer: Self-pay | Admitting: Urology

## 2024-08-25 VITALS — BP 163/94 | HR 69 | Ht 73.0 in | Wt 234.0 lb

## 2024-08-25 DIAGNOSIS — N529 Male erectile dysfunction, unspecified: Secondary | ICD-10-CM | POA: Diagnosis not present

## 2024-08-25 DIAGNOSIS — E291 Testicular hypofunction: Secondary | ICD-10-CM | POA: Diagnosis not present

## 2024-08-25 DIAGNOSIS — N401 Enlarged prostate with lower urinary tract symptoms: Secondary | ICD-10-CM

## 2024-08-25 DIAGNOSIS — N138 Other obstructive and reflux uropathy: Secondary | ICD-10-CM | POA: Diagnosis not present

## 2024-08-25 MED ORDER — TESTOSTERONE 20.25 MG/ACT (1.62%) TD GEL: TRANSDERMAL | 1 refills | Status: AC

## 2024-08-25 NOTE — Patient Instructions (Signed)
 [email or web address]

## 2024-08-26 ENCOUNTER — Ambulatory Visit: Payer: Self-pay | Admitting: Urology

## 2024-08-26 ENCOUNTER — Ambulatory Visit: Admitting: Internal Medicine

## 2024-08-26 LAB — PSA: Prostate Specific Ag, Serum: 0.4 ng/mL (ref 0.0–4.0)

## 2024-08-27 ENCOUNTER — Encounter: Payer: Self-pay | Admitting: Internal Medicine

## 2024-08-27 ENCOUNTER — Ambulatory Visit (INDEPENDENT_AMBULATORY_CARE_PROVIDER_SITE_OTHER): Admitting: Internal Medicine

## 2024-08-27 VITALS — BP 132/86 | HR 87 | Ht 73.0 in | Wt 233.6 lb

## 2024-08-27 DIAGNOSIS — M79644 Pain in right finger(s): Secondary | ICD-10-CM | POA: Diagnosis not present

## 2024-08-27 DIAGNOSIS — M109 Gout, unspecified: Secondary | ICD-10-CM

## 2024-08-27 DIAGNOSIS — Z0130 Encounter for examination of blood pressure without abnormal findings: Secondary | ICD-10-CM

## 2024-08-27 DIAGNOSIS — E782 Mixed hyperlipidemia: Secondary | ICD-10-CM | POA: Diagnosis not present

## 2024-08-27 DIAGNOSIS — R7302 Impaired glucose tolerance (oral): Secondary | ICD-10-CM

## 2024-08-27 DIAGNOSIS — R0989 Other specified symptoms and signs involving the circulatory and respiratory systems: Secondary | ICD-10-CM

## 2024-08-27 DIAGNOSIS — K219 Gastro-esophageal reflux disease without esophagitis: Secondary | ICD-10-CM

## 2024-08-27 NOTE — Progress Notes (Signed)
 Established Patient Office Visit  Subjective:  Patient ID: Jeffery Collins, male    DOB: [birth year]  Age: 53 y.o. MRN: [id number]  Chief Complaint  Patient presents with   Follow-up    1 week follow up    Patient comes in for follow up of Right thumb pain and stiffness. Xray showed mild OA. Today feels much better after Prednisone  Burst, will resume Celebrex  as needed. See by Urologist for low T, treatment started.  His BP fluctuates, needs to be monitored closely. Advised to avoid salt , lose weight and get BP monitor, will start meds if staying above 130/80. Denies, headache, no dizziness, no chest pain, no shortness of breath. Will get labs today-    No other concerns at this time.   Past Medical History:  Diagnosis Date   GAD (generalized anxiety disorder)    Hypogonadism in male    Impaired glucose tolerance    Kidney stones    Mixed hyperlipidemia    Pre-hypertension     Past Surgical History:  Procedure Laterality Date   COLONOSCOPY  2005   COLONOSCOPY WITH PROPOFOL  N/A 09/19/2022   Procedure: COLONOSCOPY WITH PROPOFOL ;  Surgeon: Unk Corinn Skiff, MD;  Location: [facility];  Service: Gastroenterology;  Laterality: N/A;   EXTRACORPOREAL SHOCK WAVE LITHOTRIPSY Left 08/28/2023   Procedure: EXTRACORPOREAL SHOCK WAVE LITHOTRIPSY (ESWL);  Surgeon: Twylla Glendia BROCKS, MD;  Location: [facility];  Service: Urology;  Laterality: Left;   LITHOTRIPSY  02/2014    Social History   Socioeconomic History   Marital status: Married    Spouse name: Not on file   Number of children: Not on file   Years of education: Not on file   Highest education level: Not on file  Occupational History   Not on file  Tobacco Use   Smoking status: Former    Types: Cigarettes   Smokeless tobacco: Never   Tobacco comments:    stopped 2000  Vaping Use   Vaping status: Never Used  Substance and Sexual Activity   Alcohol use: Yes    Alcohol/week: 2.0 standard drinks of alcohol     Types: 2 Standard drinks or equivalent per week   Drug use: No   Sexual activity: Yes  Other Topics Concern   Not on file  Social History Narrative   Not on file   Social Drivers of Health   Financial Resource Strain: Not on file  Food Insecurity: Not on file  Transportation Needs: Not on file  Physical Activity: Not on file  Stress: Not on file  Social Connections: Not on file  Intimate Partner Violence: Not on file    Family History  Problem Relation Age of Onset   Diabetes Mother    Lung cancer Father     No Known Allergies  Outpatient Medications Prior to Visit  Medication Sig   allopurinol  (ZYLOPRIM ) 300 MG tablet Take 1 tablet (300 mg total) by mouth daily.   celecoxib  (CELEBREX ) 200 MG capsule TAKE 1 CAPSULE BY MOUTH EVERY DAY   clobetasol ointment (TEMOVATE) 0.05 % Apply 1 Application topically 2 (two) times daily.   DULoxetine (CYMBALTA) 60 MG capsule TAKE 1 CAPSULE BY MOUTH DAILY   fenofibrate (TRICOR) 145 MG tablet TAKE 1 TABLET BY MOUTH EVERY DAY   Fluocinolone Acetonide Scalp 0.01 % OIL fluocinolone 0.01% topical oil Start Date: 07/27/20 Status: Ordered Repeat number: 1   icosapent  Ethyl (VASCEPA ) 1 g capsule Take 2 capsules (2 g total) by mouth 2 (two)  times daily.   tamsulosin  (FLOMAX ) 0.4 MG CAPS capsule Take 1 capsule (0.4 mg total) by mouth daily after breakfast.   Testosterone  20.25 MG/ACT (1.62%) GEL 1 pump applied to each shoulder daily   [DISCONTINUED] predniSONE  (DELTASONE ) 20 MG tablet Take 2 tablets (40 mg total) by mouth daily with breakfast. (Patient not taking: Reported on 08/27/2024)   No facility-administered medications prior to visit.    Review of Systems  Constitutional: Negative.  Negative for chills, fever and malaise/fatigue.  HENT: Negative.  Negative for congestion and sore throat.   Eyes: Negative.  Negative for blurred vision and pain.  Respiratory: Negative.  Negative for cough and shortness of breath.   Cardiovascular:  Negative.  Negative for chest pain, palpitations and leg swelling.  Gastrointestinal: Negative.  Negative for abdominal pain, blood in stool, constipation, diarrhea, heartburn, melena, nausea and vomiting.  Genitourinary: Negative.  Negative for dysuria, flank pain, frequency and urgency.  Musculoskeletal:  Positive for joint pain. Negative for myalgias.  Skin: Negative.   Neurological: Negative.  Negative for dizziness, tingling, sensory change, weakness and headaches.  Endo/Heme/Allergies: Negative.   Psychiatric/Behavioral: Negative.  Negative for depression and suicidal ideas. The patient is not nervous/anxious.        Objective:   BP 132/86   Pulse 87   Ht 6' 1 (1.854 m)   Wt 233 lb 9.6 oz (106 kg)   SpO2 97%   BMI 30.82 kg/m   Vitals:   08/27/24 1509  BP: 132/86  Pulse: 87  Height: 6' 1 (1.854 m)  Weight: 233 lb 9.6 oz (106 kg)  SpO2: 97%  BMI (Calculated): 30.83    Physical Exam Vitals and nursing note reviewed.  Constitutional:      General: He is not in acute distress.    Appearance: Normal appearance. He is not ill-appearing.  HENT:     Head: Normocephalic and atraumatic.     Nose: Nose normal.     Mouth/Throat:     Mouth: Mucous membranes are moist.     Pharynx: Oropharynx is clear.  Eyes:     Conjunctiva/sclera: Conjunctivae normal.     Pupils: Pupils are equal, round, and reactive to light.  Cardiovascular:     Rate and Rhythm: Normal rate and regular rhythm.     Pulses: Normal pulses.     Heart sounds: Normal heart sounds.  Pulmonary:     Effort: Pulmonary effort is normal.     Breath sounds: Normal breath sounds. No wheezing or rhonchi.  Abdominal:     General: Bowel sounds are normal. There is no distension.     Palpations: Abdomen is soft.     Tenderness: There is no abdominal tenderness.  Musculoskeletal:        General: Normal range of motion.     Cervical back: Normal range of motion and neck supple.     Right lower leg: No edema.      Left lower leg: No edema.  Skin:    General: Skin is warm and dry.     Capillary Refill: Capillary refill takes less than 2 seconds.  Neurological:     General: No focal deficit present.     Mental Status: He is alert and oriented to person, place, and time.     Sensory: No sensory deficit.     Motor: No weakness.  Psychiatric:        Mood and Affect: Mood normal.        Behavior: Behavior normal.  Judgment: Judgment normal.      No results found for any visits on 08/27/24.  Recent Results (from the past 2160 hours)  Testosterone      Status: Abnormal   Collection Time: 08/11/24  8:10 AM  Result Value Ref Range   Testosterone  166 (L) 264 - 916 ng/dL    Comment: Adult male reference interval is based on a population of healthy nonobese males (BMI <30) between 42 and 31 years old. Travison, et.al. JCEM [phone]. PMID: 71675896.   PSA     Status: None   Collection Time: 08/25/24 10:51 AM  Result Value Ref Range   Prostate Specific Ag, Serum 0.4 0.0 - 4.0 ng/mL    Comment: Roche ECLIA methodology. According to the American Urological Association, Serum PSA should decrease and remain at undetectable levels after radical prostatectomy. The AUA defines biochemical recurrence as an initial PSA value 0.2 ng/mL or greater followed by a subsequent confirmatory PSA value 0.2 ng/mL or greater. Values obtained with different assay methods or kits cannot be used interchangeably. Results cannot be interpreted as absolute evidence of the presence or absence of malignant disease.       Assessment & Plan:  Check labs today.Continue meds. Avoid salt- monitor BP. Problem List Items Addressed This Visit     Gastro-esophageal reflux disease without esophagitis   Impaired glucose tolerance   Mixed hyperlipidemia   Labile hypertension   Other Visit Diagnoses       Pain of right thumb    -  Primary     Gouty arthritis           Return in about 6 months (around  02/25/2025).   Total time spent: 30 minutes  FERNAND FREDY RAMAN, MD  08/27/2024   This document may have been prepared by [facility] Voice Recognition software and as such may include unintentional dictation errors.

## 2024-08-28 LAB — CMP14+EGFR
ALT: 16 IU/L (ref 0–44)
AST: 16 IU/L (ref 0–40)
Albumin: 4.5 g/dL (ref 3.8–4.9)
Alkaline Phosphatase: 89 IU/L (ref 47–123)
BUN/Creatinine Ratio: 18 (ref 9–20)
BUN: 17 mg/dL (ref 6–24)
Bilirubin Total: 0.3 mg/dL (ref 0.0–1.2)
CO2: 29 mmol/L (ref 20–29)
Calcium: 10.4 mg/dL — ABNORMAL HIGH (ref 8.7–10.2)
Chloride: 99 mmol/L (ref 96–106)
Creatinine, Ser: 0.96 mg/dL (ref 0.76–1.27)
Globulin, Total: 2.6 g/dL (ref 1.5–4.5)
Glucose: 100 mg/dL — ABNORMAL HIGH (ref 70–99)
Potassium: 4.4 mmol/L (ref 3.5–5.2)
Sodium: 142 mmol/L (ref 134–144)
Total Protein: 7.1 g/dL (ref 6.0–8.5)
eGFR: 95 mL/min/1.73 (ref >59)

## 2024-08-28 LAB — CBC WITH DIFFERENTIAL/PLATELET
Basophils Absolute: 0.1 x10E3/uL (ref 0.0–0.2)
Basos: 1 %
EOS (ABSOLUTE): 0.2 x10E3/uL (ref 0.0–0.4)
Eos: 2 %
Hematocrit: 45 % (ref 37.5–51.0)
Hemoglobin: 14.8 g/dL (ref 13.0–17.7)
Immature Grans (Abs): 0.1 x10E3/uL (ref 0.0–0.1)
Immature Granulocytes: 1 %
Lymphocytes Absolute: 2.5 x10E3/uL (ref 0.7–3.1)
Lymphs: 30 %
MCH: 28.4 pg (ref 26.6–33.0)
MCHC: 32.9 g/dL (ref 31.5–35.7)
MCV: 86 fL (ref 79–97)
Monocytes Absolute: 0.6 x10E3/uL (ref 0.1–0.9)
Monocytes: 8 %
Neutrophils Absolute: 5 x10E3/uL (ref 1.4–7.0)
Neutrophils: 58 %
Platelets: 308 x10E3/uL (ref 150–450)
RBC: 5.22 x10E6/uL (ref 4.14–5.80)
RDW: 13.1 % (ref 11.6–15.4)
WBC: 8.4 x10E3/uL (ref 3.4–10.8)

## 2024-08-28 LAB — HEMOGLOBIN A1C
Est. average glucose Bld gHb Est-mCnc: 117 mg/dL
Hgb A1c MFr Bld: 5.7 % — ABNORMAL HIGH (ref 4.8–5.6)

## 2024-08-28 LAB — LIPID PANEL W/O CHOL/HDL RATIO
Cholesterol, Total: 167 mg/dL (ref 100–199)
HDL: 51 mg/dL (ref >39)
LDL Chol Calc (NIH): 53 mg/dL (ref 0–99)
Triglycerides: 419 mg/dL — ABNORMAL HIGH (ref 0–149)
VLDL Cholesterol Cal: 63 mg/dL — ABNORMAL HIGH (ref 5–40)

## 2024-09-28 ENCOUNTER — Encounter: Payer: Self-pay | Admitting: Urology

## 2024-09-28 NOTE — Progress Notes (Unsigned)
 ERROR

## 2025-02-25 ENCOUNTER — Ambulatory Visit: Admitting: Internal Medicine
